# Patient Record
Sex: Female | Born: 1970 | Race: White | Hispanic: No | Marital: Single | Smoking: Never smoker
Health system: Southern US, Community
[De-identification: ages and names within clinical notes are randomized; demographics above are authoritative.]

## PROBLEM LIST (undated history)

## (undated) DIAGNOSIS — Z87898 Personal history of other specified conditions: Secondary | ICD-10-CM

## (undated) DIAGNOSIS — Z950 Presence of cardiac pacemaker: Secondary | ICD-10-CM

## (undated) DIAGNOSIS — E282 Polycystic ovarian syndrome: Secondary | ICD-10-CM

## (undated) DIAGNOSIS — R7301 Impaired fasting glucose: Secondary | ICD-10-CM

## (undated) DIAGNOSIS — R55 Syncope and collapse: Secondary | ICD-10-CM

## (undated) HISTORY — PX: TONSILLECTOMY AND ADENOIDECTOMY: SUR1326

## (undated) HISTORY — DX: Personal history of other specified conditions: Z87.898

## (undated) HISTORY — PX: LACERATION REPAIR: SHX5168

## (undated) HISTORY — PX: COLONOSCOPY: SHX174

## (undated) HISTORY — DX: Impaired fasting glucose: R73.01

## (undated) HISTORY — DX: Syncope and collapse: R55

---

## 1993-06-23 HISTORY — PX: AUGMENTATION MAMMAPLASTY: SUR837

## 1998-02-07 ENCOUNTER — Other Ambulatory Visit: Admission: RE | Admit: 1998-02-07 | Discharge: 1998-02-07 | Payer: Self-pay | Admitting: Gynecology

## 1998-07-20 ENCOUNTER — Other Ambulatory Visit: Admission: RE | Admit: 1998-07-20 | Discharge: 1998-07-20 | Payer: Self-pay | Admitting: Gynecology

## 1998-08-03 ENCOUNTER — Ambulatory Visit (HOSPITAL_COMMUNITY): Admission: RE | Admit: 1998-08-03 | Discharge: 1998-08-03 | Payer: Self-pay | Admitting: Gynecology

## 1998-10-11 ENCOUNTER — Other Ambulatory Visit: Admission: RE | Admit: 1998-10-11 | Discharge: 1998-10-11 | Payer: Self-pay | Admitting: Gynecology

## 1999-07-22 ENCOUNTER — Other Ambulatory Visit: Admission: RE | Admit: 1999-07-22 | Discharge: 1999-07-22 | Payer: Self-pay | Admitting: Gynecology

## 2000-02-11 ENCOUNTER — Other Ambulatory Visit: Admission: RE | Admit: 2000-02-11 | Discharge: 2000-02-11 | Payer: Self-pay | Admitting: Gynecology

## 2000-08-17 ENCOUNTER — Other Ambulatory Visit: Admission: RE | Admit: 2000-08-17 | Discharge: 2000-08-17 | Payer: Self-pay | Admitting: Gynecology

## 2001-10-21 ENCOUNTER — Other Ambulatory Visit: Admission: RE | Admit: 2001-10-21 | Discharge: 2001-10-21 | Payer: Self-pay | Admitting: Gynecology

## 2002-11-16 ENCOUNTER — Other Ambulatory Visit: Admission: RE | Admit: 2002-11-16 | Discharge: 2002-11-16 | Payer: Self-pay | Admitting: Gynecology

## 2004-03-14 ENCOUNTER — Other Ambulatory Visit: Admission: RE | Admit: 2004-03-14 | Discharge: 2004-03-14 | Payer: Self-pay | Admitting: Gynecology

## 2005-08-15 ENCOUNTER — Other Ambulatory Visit: Admission: RE | Admit: 2005-08-15 | Discharge: 2005-08-15 | Payer: Self-pay | Admitting: Gynecology

## 2006-11-12 ENCOUNTER — Other Ambulatory Visit: Admission: RE | Admit: 2006-11-12 | Discharge: 2006-11-12 | Payer: Self-pay | Admitting: Gynecology

## 2011-07-23 ENCOUNTER — Other Ambulatory Visit: Payer: Self-pay | Admitting: Gynecology

## 2011-07-23 DIAGNOSIS — Z1231 Encounter for screening mammogram for malignant neoplasm of breast: Secondary | ICD-10-CM

## 2011-07-31 ENCOUNTER — Ambulatory Visit
Admission: RE | Admit: 2011-07-31 | Discharge: 2011-07-31 | Disposition: A | Discharge status: Home / Self Care | Payer: BC Managed Care – PPO | Source: Ambulatory Visit | Attending: Gynecology | Admitting: Gynecology

## 2011-07-31 DIAGNOSIS — Z1231 Encounter for screening mammogram for malignant neoplasm of breast: Secondary | ICD-10-CM

## 2013-02-10 ENCOUNTER — Other Ambulatory Visit: Payer: Self-pay

## 2013-02-25 ENCOUNTER — Other Ambulatory Visit: Payer: Self-pay

## 2013-02-25 DIAGNOSIS — Z1231 Encounter for screening mammogram for malignant neoplasm of breast: Secondary | ICD-10-CM

## 2013-03-24 ENCOUNTER — Ambulatory Visit
Admission: RE | Admit: 2013-03-24 | Discharge: 2013-03-24 | Disposition: A | Discharge status: Home / Self Care | Payer: BC Managed Care – PPO | Source: Ambulatory Visit

## 2013-03-24 DIAGNOSIS — Z1231 Encounter for screening mammogram for malignant neoplasm of breast: Secondary | ICD-10-CM

## 2015-01-10 ENCOUNTER — Other Ambulatory Visit: Payer: Self-pay

## 2015-01-10 DIAGNOSIS — Z1231 Encounter for screening mammogram for malignant neoplasm of breast: Secondary | ICD-10-CM

## 2015-02-16 ENCOUNTER — Ambulatory Visit
Admission: RE | Admit: 2015-02-16 | Discharge: 2015-02-16 | Disposition: A | Discharge status: Home / Self Care | Payer: Self-pay | Source: Ambulatory Visit

## 2015-02-16 DIAGNOSIS — Z1231 Encounter for screening mammogram for malignant neoplasm of breast: Secondary | ICD-10-CM

## 2015-02-20 ENCOUNTER — Other Ambulatory Visit: Payer: Self-pay | Admitting: Internal Medicine

## 2015-02-20 DIAGNOSIS — R928 Other abnormal and inconclusive findings on diagnostic imaging of breast: Secondary | ICD-10-CM

## 2015-02-23 ENCOUNTER — Ambulatory Visit
Admission: RE | Admit: 2015-02-23 | Discharge: 2015-02-23 | Disposition: A | Discharge status: Home / Self Care | Payer: BLUE CROSS/BLUE SHIELD | Source: Ambulatory Visit | Attending: Internal Medicine | Admitting: Internal Medicine

## 2015-02-23 DIAGNOSIS — R928 Other abnormal and inconclusive findings on diagnostic imaging of breast: Secondary | ICD-10-CM

## 2015-07-17 ENCOUNTER — Other Ambulatory Visit: Payer: Self-pay | Admitting: Internal Medicine

## 2015-07-17 DIAGNOSIS — N6310 Unspecified lump in the right breast, unspecified quadrant: Secondary | ICD-10-CM

## 2015-08-23 ENCOUNTER — Ambulatory Visit
Admission: RE | Admit: 2015-08-23 | Discharge: 2015-08-23 | Disposition: A | Discharge status: Home / Self Care | Payer: BLUE CROSS/BLUE SHIELD | Source: Ambulatory Visit | Attending: Internal Medicine | Admitting: Internal Medicine

## 2015-08-23 DIAGNOSIS — N6310 Unspecified lump in the right breast, unspecified quadrant: Secondary | ICD-10-CM

## 2016-02-04 ENCOUNTER — Other Ambulatory Visit: Payer: Self-pay | Admitting: Internal Medicine

## 2016-02-04 DIAGNOSIS — Z1231 Encounter for screening mammogram for malignant neoplasm of breast: Secondary | ICD-10-CM

## 2016-02-20 ENCOUNTER — Ambulatory Visit: Payer: BLUE CROSS/BLUE SHIELD

## 2016-02-20 ENCOUNTER — Ambulatory Visit
Admission: RE | Admit: 2016-02-20 | Discharge: 2016-02-20 | Disposition: A | Discharge status: Home / Self Care | Payer: PRIVATE HEALTH INSURANCE | Source: Ambulatory Visit | Attending: Internal Medicine | Admitting: Internal Medicine

## 2016-02-20 DIAGNOSIS — Z1231 Encounter for screening mammogram for malignant neoplasm of breast: Secondary | ICD-10-CM

## 2017-02-12 ENCOUNTER — Other Ambulatory Visit: Payer: Self-pay | Admitting: Internal Medicine

## 2017-02-12 DIAGNOSIS — Z1231 Encounter for screening mammogram for malignant neoplasm of breast: Secondary | ICD-10-CM

## 2017-02-25 ENCOUNTER — Ambulatory Visit
Admission: RE | Admit: 2017-02-25 | Discharge: 2017-02-25 | Disposition: A | Discharge status: Home / Self Care | Payer: PRIVATE HEALTH INSURANCE | Source: Ambulatory Visit | Attending: Internal Medicine | Admitting: Internal Medicine

## 2017-02-25 DIAGNOSIS — Z1231 Encounter for screening mammogram for malignant neoplasm of breast: Secondary | ICD-10-CM

## 2017-06-19 DIAGNOSIS — R55 Syncope and collapse: Secondary | ICD-10-CM | POA: Insufficient documentation

## 2017-06-19 DIAGNOSIS — R7303 Prediabetes: Secondary | ICD-10-CM | POA: Insufficient documentation

## 2017-06-20 DIAGNOSIS — E162 Hypoglycemia, unspecified: Secondary | ICD-10-CM | POA: Insufficient documentation

## 2017-06-20 DIAGNOSIS — S01112A Laceration without foreign body of left eyelid and periocular area, initial encounter: Secondary | ICD-10-CM | POA: Insufficient documentation

## 2017-06-20 DIAGNOSIS — Z87898 Personal history of other specified conditions: Secondary | ICD-10-CM

## 2017-06-20 HISTORY — DX: Personal history of other specified conditions: Z87.898

## 2017-08-11 ENCOUNTER — Encounter: Payer: Self-pay | Admitting: Internal Medicine

## 2017-08-12 ENCOUNTER — Encounter: Payer: Self-pay | Admitting: *Deleted

## 2017-08-12 ENCOUNTER — Encounter: Payer: Self-pay | Admitting: Neurology

## 2017-08-12 ENCOUNTER — Ambulatory Visit (INDEPENDENT_AMBULATORY_CARE_PROVIDER_SITE_OTHER): Payer: PRIVATE HEALTH INSURANCE | Admitting: Neurology

## 2017-08-12 VITALS — BP 139/80 | HR 77 | Ht 66.0 in | Wt 158.0 lb

## 2017-08-12 DIAGNOSIS — R4182 Altered mental status, unspecified: Secondary | ICD-10-CM | POA: Diagnosis not present

## 2017-08-12 DIAGNOSIS — IMO0002 Reserved for concepts with insufficient information to code with codable children: Secondary | ICD-10-CM | POA: Insufficient documentation

## 2017-08-12 DIAGNOSIS — R4020 Unspecified coma: Secondary | ICD-10-CM | POA: Diagnosis not present

## 2017-08-12 NOTE — Progress Notes (Signed)
PATIENT: Emily Cook DOB: 1971-01-13  Chief Complaint  Patient presents with  . Syncopal Episodes    Orthostatic Vitals: Lying: 139/80, 77, Sitting: 129/79, 77, Standing: 122/79, 80, Standing x 3 minutes: 117/79, 89.  She is here with her mother, Emily Cook.  Reports three syncope episodes (Dec and Feb).  She had no warning with the episodes in December.  She was had an extensive work-up at [facility] ED that did not reveal a cause.  She felt a little hot prior to the third event on 08/11/17.  She was taken off both Metformin and Phentermine in December.  Her blood sugar has been stable off Metformin.  Marland Kitchen PCP    Martha Clan, MD     HISTORICAL  Emily Cook is a 47 year old female, seen in refer by primary care doctor Martha Clan for evaluation of syncope episode, initial evaluation was on August 12, 2017.  She is accompanied by her stepmother Emily Cook at today's clinical visit.  Reviewed and summarized the referring note, she had a history of polycystic ovarian disease, has been treated with Metformin 1500 mg daily for over a decade, also had a history of mild obesity, irregular eating patterns.  She suffered 3 passing out spells, 2 spells on June 19, 2017, he woke up in the morning, started her routine, while taking a shower, without warning signs, she passed out on the floor, with left frontal area laceration,  She was treated at local emergency room, CT head without contrast was normal  Monitoring of 2 weeks on July 21, 2017, normal sinus rhythm, occasional PVCs, no sustained arrhythmia noted, Echocardiogram from June 20, 2017, no significant abnormality. EKG December 2018, normal sinus rhythm,  Laboratory evaluations, June 20, 2017, potassium was 3.6, albumin was mildly low at 3.3, creatinine 0.58. LDL 46, triglycerides 331, CBC showed hemoglobin of 11.4,  She was discharged from emergency room 6 hours later, while walking in the parking lot the same day in the  afternoon, she has missed her breakfast first, and lunch, without warning signs, she face planted on the parking lot, passed out transiently without any warning signs,  She does report that she has occasionally heart palpitation, has cardiologist appointment pending on August 13, 2017,  Most recent passing out episode was on August 11, 2017, she woke up with her morning routine, right bikes for a few minutes, had her breakfast, morphine, water, boiled eggs, while trying to taking a shower, she noticed a rising hot sensation throughout her body, then woke up on the floor.  She previously had on healthy eating habit, including drinking diet Mountain Dew 6 cans each day, she has made major changes in her diet recently, She denies focal signs, no history of seizure  REVIEW OF SYSTEMS: Full 14 system review of systems performed and notable only for feeling hot  ALLERGIES: Not on File  HOME MEDICATIONS: Current Outpatient Medications  Medication Sig Dispense Refill  . drospirenone-ethinyl estradiol (YAZ,GIANVI,LORYNA) 3-0.02 MG tablet Take 1 tablet by mouth daily.    . metFORMIN (GLUCOPHAGE-XR) 500 MG 24 hr tablet Take 500 mg by mouth every morning.    . Multiple Vitamin (MULTIVITAMIN) tablet Take 1 tablet by mouth daily.    . vitamin B-12 (CYANOCOBALAMIN) 1000 MCG tablet Take 1,000 mcg by mouth daily.     No current facility-administered medications for this visit.     PAST MEDICAL HISTORY: Past Medical History:  Diagnosis Date  . History of prediabetes 06/20/2017  . Syncope and collapse  PAST SURGICAL HISTORY: Past Surgical History:  Procedure Laterality Date  . AUGMENTATION MAMMAPLASTY Bilateral   . lacereation of left eyebrow      FAMILY HISTORY: Family History  Problem Relation Age of Onset  . Hypertension Mother   . Colon polyps Father   . Diabetes Paternal Grandfather   . Breast cancer Neg Hx     SOCIAL HISTORY:  Social History   Socioeconomic History  .  Marital status: Single    Spouse name: Not on file  . Number of children: 0  . Years of education: 16  . Highest education level: Bachelor's degree (e.g., BA, AB, BS)  Social Needs  . Financial resource strain: Not on file  . Food insecurity - worry: Not on file  . Food insecurity - inability: Not on file  . Transportation needs - medical: Not on file  . Transportation needs - non-medical: Not on file  Occupational History  . Occupation: Airline pilots[employer]  Tobacco Use  . Smoking status: Never Smoker  . Smokeless tobacco: Never Used  Substance and Sexual Activity  . Alcohol use: No    Frequency: Never  . Drug use: No  . Sexual activity: Not on file  Other Topics Concern  . Not on file  Social History Narrative   Lives alone but has been staying with parents while having syncopal events.   Left-handed.   No caffeine use.     PHYSICAL EXAM   Vitals:   08/12/17 1129  BP: 139/80  Pulse: 77  Weight: 158 lb (71.7 kg)  Height: 5\' 6"  (1.676 m)    Not recorded      Body mass index is 25.5 kg/m.  PHYSICAL EXAMNIATION:  Gen: NAD, conversant, well nourised, obese, well groomed                     Cardiovascular: Regular rate rhythm, no peripheral edema, warm, nontender. Eyes: Conjunctivae clear without exudates or hemorrhage Neck: Supple, no carotid bruits. Pulmonary: Clear to auscultation bilaterally   NEUROLOGICAL EXAM:  MENTAL STATUS: Speech:    Speech is normal; fluent and spontaneous with normal comprehension.  Cognition:     Orientation to time, place and person     Normal recent and remote memory     Normal Attention span and concentration     Normal Language, naming, repeating,spontaneous speech     Fund of knowledge   CRANIAL NERVES: CN II: Visual fields are full to confrontation. Fundoscopic exam is normal with sharp discs and no vascular changes. Pupils are round equal and briskly reactive to light. CN III, IV, VI: extraocular movement are normal. No ptosis. CN  V: Facial sensation is intact to pinprick in all 3 divisions bilaterally. Corneal responses are intact.  CN VII: Face is symmetric with normal eye closure and smile. CN VIII: Hearing is normal to rubbing fingers CN IX, X: Palate elevates symmetrically. Phonation is normal. CN XI: Head turning and shoulder shrug are intact CN XII: Tongue is midline with normal movements and no atrophy.  MOTOR: There is no pronator drift of out-stretched arms. Muscle bulk and tone are normal. Muscle strength is normal.  REFLEXES: Reflexes are 2+ and symmetric at the biceps, triceps, knees, and ankles. Plantar responses are flexor.  SENSORY: Intact to light touch, pinprick, positional sensation and vibratory sensation are intact in fingers and toes.  COORDINATION: Rapid alternating movements and fine finger movements are intact. There is no dysmetria on finger-to-nose and heel-knee-shin.    GAIT/STANCE:  Posture is normal. Gait is steady with normal steps, base, arm swing, and turning. Heel and toe walking are normal. Tandem gait is normal.  Romberg is absent.   DIAGNOSTIC DATA (LABS, IMAGING, TESTING) - I reviewed patient records, labs, notes, testing and imaging myself where available.   ASSESSMENT AND PLAN  Kimyah Frein is a 47 y.o. female    Sudden onset episode of passing out  Differentiation diagnosis include cardiac syncope, versus seizure  Complete evaluation with MRI of the brain with and without contrast  EEG  Document of the event  According to [facility] law, patient should not drive until episode free from episodes of unexplained sudden loss of consciousness.  Levert Feinstein, M.D. Ph.D.  [facility] [address], [address] [place], [place] [address] Ph: [phone] Fax: [phone]  CC: Martha Clan, MD

## 2017-08-13 ENCOUNTER — Ambulatory Visit (INDEPENDENT_AMBULATORY_CARE_PROVIDER_SITE_OTHER): Payer: No Typology Code available for payment source | Admitting: Internal Medicine

## 2017-08-13 ENCOUNTER — Encounter: Payer: Self-pay | Admitting: Internal Medicine

## 2017-08-13 VITALS — BP 124/71 | HR 87 | Ht 66.0 in | Wt 158.0 lb

## 2017-08-13 DIAGNOSIS — R4182 Altered mental status, unspecified: Secondary | ICD-10-CM | POA: Insufficient documentation

## 2017-08-13 DIAGNOSIS — R55 Syncope and collapse: Secondary | ICD-10-CM

## 2017-08-13 NOTE — Progress Notes (Signed)
ELECTROPHYSIOLOGY CONSULT NOTE  Patient ID: Aritha Huckeba, MRN: [id number], DOB/AGE: 47-Dec-1972 47 y.o. Admit date: (Not on file) Date of Consult: 08/13/2017  Primary Physician: Martha Clan, MD Primary Cardiologist: new      [name] is a 47 y.o. female who is being seen today for the evaluation of syncope at the request of DS.    HPI [name] is a 47 y.o. female referred for syncope.  She has a history of phentermine exposure for weight loss.  She has prediabetes in the context of PCO S. 06/19/18 she was taking a shower and collapsed in the shower without warning.  Her head went through the drywall.  She recalls no prodromal symptoms and no recovery symptoms.  There is no residual orthostatic intolerance or fatigue.  She ended up going to the hospital where evaluation was negative.  She was discharged.  She went to her home and was walking out with a bag of clothes to go stay with her mom and she collapsed without warning "face planted.  She was evaluated this time with an admission.  Echocardiogram was normal; stress Myoview was normal ECG was normal.  She was given a 2-week monitor which was normal.  She then did fine until 2 days ago when she collapsed again.  She was in the bathroom about ready to get in the shower.  Had a brief prodrome of warmth.  She collapsed to the floor.  She awakened if she thinks quite momentarily later.  She was able to stand without residual orthostatic intolerance or fatigue.  She has a history of palpitations.  These are scant and infrequent.  She does not have lightheadedness with showers, orthostasis  she is fit without exercise intolerance.  She does not use alcohol or recreational drugs.  He does use a great deal of caffeine.  She had not been using caffeine, phentermine or metformin since the syncope in 12/28.  I should also note that she is lost 60 pounds Past Medical History:  Diagnosis Date  . History of prediabetes  06/20/2017  . Syncope and collapse       Surgical History:  Past Surgical History:  Procedure Laterality Date  . AUGMENTATION MAMMAPLASTY Bilateral   . lacereation of left eyebrow       Home Meds: Prior to Admission medications   Medication Sig Start Date End Date Taking? Authorizing Provider  drospirenone-ethinyl estradiol (YAZ,GIANVI,LORYNA) 3-0.02 MG tablet Take 1 tablet by mouth daily.   Yes [provider]  metFORMIN (GLUCOPHAGE-XR) 500 MG 24 hr tablet Take 500 mg by mouth every morning.   Yes [provider]  Multiple Vitamin (MULTIVITAMIN) tablet Take 1 tablet by mouth daily.   Yes [provider]  vitamin B-12 (CYANOCOBALAMIN) 1000 MCG tablet Take 1,000 mcg by mouth daily.   Yes [provider]    Allergies: Not on File  Social History   Socioeconomic History  . Marital status: Single    Spouse name: Not on file  . Number of children: 0  . Years of education: 16  . Highest education level: Bachelor's degree (e.g., BA, AB, BS)  Social Needs  . Financial resource strain: Not on file  . Food insecurity - worry: Not on file  . Food insecurity - inability: Not on file  . Transportation needs - medical: Not on file  . Transportation needs - non-medical: Not on file  Occupational History  . Occupation: Airline pilot  Tobacco Use  . Smoking status:  Never Smoker  . Smokeless tobacco: Never Used  Substance and Sexual Activity  . Alcohol use: No    Frequency: Never  . Drug use: No  . Sexual activity: Not on file  Other Topics Concern  . Not on file  Social History Narrative   Lives alone but has been staying with parents while having syncopal events.   Left-handed.   No caffeine use.     Family History  Problem Relation Age of Onset  . Hypertension Mother   . Colon polyps Father   . Diabetes Paternal Grandfather   . Breast cancer Neg Hx      ROS:  Please see the history of present illness.     All other systems reviewed and  negative.    Physical Exam: Blood pressure 124/71, pulse 87, height 5\' 6"  (1.676 m), weight 158 lb (71.7 kg), SpO2 98 %. General: Well developed, well nourished female in no acute distress. Head: Normocephalic, atraumatic, sclera non-icteric, no xanthomas, nares are without discharge. EENT: normal  Lymph Nodes:  none Neck: Negative for carotid bruits. JVD not elevated. Back:without scoliosis kyphosis Lungs: Clear bilaterally to auscultation without wheezes, rales, or rhonchi. Breathing is unlabored. Heart: RRR with S1 S2. No  murmur . No rubs, or gallops appreciated. Abdomen: Soft, non-tender, non-distended with normoactive bowel sounds. No hepatomegaly. No rebound/guarding. No obvious abdominal masses. Msk:  Strength and tone appear normal for age. Extremities: No clubbing or cyanosis. No edema.  Distal pedal pulses are 2+ and equal bilaterally. Skin: Warm and Dry Neuro: Alert and oriented X 3. CN III-XII intact Grossly normal sensory and motor function . Psych:  Responds to questions appropriately with a normal affect.      Labs: Cardiac Enzymes No results for input(s): CKTOTAL, CKMB, TROPONINI in the last 72 hours. CBC No results found for: WBC, HGB, HCT, MCV, PLT PROTIME: No results for input(s): LABPROT, INR in the last 72 hours. Chemistry No results for input(s): NA, K, CL, CO2, BUN, CREATININE, CALCIUM, PROT, BILITOT, ALKPHOS, ALT, AST, GLUCOSE in the last 168 hours.  Invalid input(s): LABALBU Lipids No results found for: CHOL, HDL, LDLCALC, TRIG BNP No results found for: PROBNP Thyroid Function Tests: No results for input(s): TSH, T4TOTAL, T3FREE, THYROIDAB in the last 72 hours.  Invalid input(s): FREET3 Miscellaneous No results found for: DDIMER  Radiology/Studies:  No results found.  EKG: Sinus at 78 Intervals 14/09/37 Axis 70 Nonspecific ST changes   Assessment and Plan:  Syncope  Phentermine exposure  PCO S    The patient has had recurrent  syncope-abrupt in onset and offset.  The only clue as to the prodrome was a bit of warmth prior to the collapse on the last event.  Her ECG is normal cardiac evaluation so far is normal.  This would make a neurally mediated reflex the most likely explanation.  The absence of a trigger is noteworthy.  There is apparently an association between phentermine exposure and vasovagal syncope that the patient has elucidated.  My research on global scholar has scant association.  We were working with the laboratory try to explore this further.  There is also a 1447-month hiatus since her last phentermine exposure to most recent episode.  I do not know how this fits into the story.  Given the fact that she has a normal heart, a bradycardia arrhythmia is unlikely not withstanding the abrupt onset and offset.  There is no hallucinations of sense to suggest temporal lobe seizures.  Her palpitations might  be a marker of a tachycardia which might be a trigger for neurally mediated reflex.  We have talked about the use of a loop recorder for the identification of arrhythmia or not in the presence of a recurrent event.  Value of tilt table testing I think in the context of her symptoms she is likely to be helpful.  I have suggested that not withstanding, empiric therapy is directed at vasomotor instability, i.e. salt water and compressive clothing are probably reasonable.  I have given her these lists.  She is advised about [place] driving restrictions and that she ought not to drive for at least 3 months.      Sherryl Manges

## 2017-08-13 NOTE — Patient Instructions (Addendum)
Medication Instructions:  Your physician recommends that you continue on your current medications as directed. Please refer to the Current Medication list given to you today.  Labwork: None ordered.  Testing/Procedures: Loop Recorder insertion  Follow-Up: Your physician recommends that you schedule a follow-up appointment in:   We will call to follow up   Any Other Special Instructions Will Be Listed Below (If Applicable).     If you need a refill on your cardiac medications before your next appointment, please call your pharmacy.

## 2017-08-13 NOTE — H&P (View-Only) (Signed)
ELECTROPHYSIOLOGY CONSULT NOTE  Patient ID: Emily Cook, MRN: [id number], DOB/AGE: 47-Dec-1972 47 y.o. Admit date: (Not on file) Date of Consult: 08/13/2017  Primary Physician: Martha Clan, MD Primary Cardiologist: new      [name] is a 47 y.o. female who is being seen today for the evaluation of syncope at the request of DS.    HPI Emily Cook is a 47 y.o. female referred for syncope.  She has a history of phentermine exposure for weight loss.  She has prediabetes in the context of PCO S. 06/19/18 she was taking a shower and collapsed in the shower without warning.  Her head went through the drywall.  She recalls no prodromal symptoms and no recovery symptoms.  There is no residual orthostatic intolerance or fatigue.  She ended up going to the hospital where evaluation was negative.  She was discharged.  She went to her home and was walking out with a bag of clothes to go stay with her mom and she collapsed without warning "face planted.  She was evaluated this time with an admission.  Echocardiogram was normal; stress Myoview was normal ECG was normal.  She was given a 2-week monitor which was normal.  She then did fine until 2 days ago when she collapsed again.  She was in the bathroom about ready to get in the shower.  Had a brief prodrome of warmth.  She collapsed to the floor.  She awakened if she thinks quite momentarily later.  She was able to stand without residual orthostatic intolerance or fatigue.  She has a history of palpitations.  These are scant and infrequent.  She does not have lightheadedness with showers, orthostasis  she is fit without exercise intolerance.  She does not use alcohol or recreational drugs.  He does use a great deal of caffeine.  She had not been using caffeine, phentermine or metformin since the syncope in 12/28.  I should also note that she is lost 60 pounds Past Medical History:  Diagnosis Date  . History of prediabetes  06/20/2017  . Syncope and collapse       Surgical History:  Past Surgical History:  Procedure Laterality Date  . AUGMENTATION MAMMAPLASTY Bilateral   . lacereation of left eyebrow       Home Meds: Prior to Admission medications   Medication Sig Start Date End Date Taking? Authorizing Provider  drospirenone-ethinyl estradiol (YAZ,GIANVI,LORYNA) 3-0.02 MG tablet Take 1 tablet by mouth daily.   Yes [provider]  metFORMIN (GLUCOPHAGE-XR) 500 MG 24 hr tablet Take 500 mg by mouth every morning.   Yes [provider]  Multiple Vitamin (MULTIVITAMIN) tablet Take 1 tablet by mouth daily.   Yes [provider]  vitamin B-12 (CYANOCOBALAMIN) 1000 MCG tablet Take 1,000 mcg by mouth daily.   Yes [provider]    Allergies: Not on File  Social History   Socioeconomic History  . Marital status: Single    Spouse name: Not on file  . Number of children: 0  . Years of education: 16  . Highest education level: Bachelor's degree (e.g., BA, AB, BS)  Social Needs  . Financial resource strain: Not on file  . Food insecurity - worry: Not on file  . Food insecurity - inability: Not on file  . Transportation needs - medical: Not on file  . Transportation needs - non-medical: Not on file  Occupational History  . Occupation: Airline pilot  Tobacco Use  . Smoking status:  Never Smoker  . Smokeless tobacco: Never Used  Substance and Sexual Activity  . Alcohol use: No    Frequency: Never  . Drug use: No  . Sexual activity: Not on file  Other Topics Concern  . Not on file  Social History Narrative   Lives alone but has been staying with parents while having syncopal events.   Left-handed.   No caffeine use.     Family History  Problem Relation Age of Onset  . Hypertension Mother   . Colon polyps Father   . Diabetes Paternal Grandfather   . Breast cancer Neg Hx      ROS:  Please see the history of present illness.     All other systems reviewed and  negative.    Physical Exam: Blood pressure 124/71, pulse 87, height 5\' 6"  (1.676 m), weight 158 lb (71.7 kg), SpO2 98 %. General: Well developed, well nourished female in no acute distress. Head: Normocephalic, atraumatic, sclera non-icteric, no xanthomas, nares are without discharge. EENT: normal  Lymph Nodes:  none Neck: Negative for carotid bruits. JVD not elevated. Back:without scoliosis kyphosis Lungs: Clear bilaterally to auscultation without wheezes, rales, or rhonchi. Breathing is unlabored. Heart: RRR with S1 S2. No  murmur . No rubs, or gallops appreciated. Abdomen: Soft, non-tender, non-distended with normoactive bowel sounds. No hepatomegaly. No rebound/guarding. No obvious abdominal masses. Msk:  Strength and tone appear normal for age. Extremities: No clubbing or cyanosis. No edema.  Distal pedal pulses are 2+ and equal bilaterally. Skin: Warm and Dry Neuro: Alert and oriented X 3. CN III-XII intact Grossly normal sensory and motor function . Psych:  Responds to questions appropriately with a normal affect.      Labs: Cardiac Enzymes No results for input(s): CKTOTAL, CKMB, TROPONINI in the last 72 hours. CBC No results found for: WBC, HGB, HCT, MCV, PLT PROTIME: No results for input(s): LABPROT, INR in the last 72 hours. Chemistry No results for input(s): NA, K, CL, CO2, BUN, CREATININE, CALCIUM, PROT, BILITOT, ALKPHOS, ALT, AST, GLUCOSE in the last 168 hours.  Invalid input(s): LABALBU Lipids No results found for: CHOL, HDL, LDLCALC, TRIG BNP No results found for: PROBNP Thyroid Function Tests: No results for input(s): TSH, T4TOTAL, T3FREE, THYROIDAB in the last 72 hours.  Invalid input(s): FREET3 Miscellaneous No results found for: DDIMER  Radiology/Studies:  No results found.  EKG: Sinus at 78 Intervals 14/09/37 Axis 70 Nonspecific ST changes   Assessment and Plan:  Syncope  Phentermine exposure  PCO S    The patient has had recurrent  syncope-abrupt in onset and offset.  The only clue as to the prodrome was a bit of warmth prior to the collapse on the last event.  Her ECG is normal cardiac evaluation so far is normal.  This would make a neurally mediated reflex the most likely explanation.  The absence of a trigger is noteworthy.  There is apparently an association between phentermine exposure and vasovagal syncope that the patient has elucidated.  My research on global scholar has scant association.  We were working with the laboratory try to explore this further.  There is also a 1447-month hiatus since her last phentermine exposure to most recent episode.  I do not know how this fits into the story.  Given the fact that she has a normal heart, a bradycardia arrhythmia is unlikely not withstanding the abrupt onset and offset.  There is no hallucinations of sense to suggest temporal lobe seizures.  Her palpitations might  be a marker of a tachycardia which might be a trigger for neurally mediated reflex.  We have talked about the use of a loop recorder for the identification of arrhythmia or not in the presence of a recurrent event.  Value of tilt table testing I think in the context of her symptoms she is likely to be helpful.  I have suggested that not withstanding, empiric therapy is directed at vasomotor instability, i.e. salt water and compressive clothing are probably reasonable.  I have given her these lists.  She is advised about [place] driving restrictions and that she ought not to drive for at least 3 months.      Sherryl Manges

## 2017-08-14 ENCOUNTER — Telehealth: Payer: Self-pay

## 2017-08-14 NOTE — Telephone Encounter (Signed)
Called patient and made her aware that she has been scheduled for her Loop Recorder Implant on 08/21/17 with Dr. Graciela HusbandsKlein at 7:00 AM. Reviewed pre-procedure instructions (see below). Made follow-up appointment with device clinic for wound check on 08/31/17 at 4:00 PM. Left surgical scrub with instruction letter up front for the patient to pick up. Patient verbalized understanding and thanked me for the call.     Pre-procedure Instructions:  Please [facility] at 6:30 AM.  You may have a light breakfast the morning of the procedure  You may take all of your medications the morning of the procedure  Wash chest & neck area with the antibacterial soap/surgical scrub the night before and the morning of the procedure  Follow-Up: Follow-up appointment in 7-10 days after implant with device clinic for wound check.- 08/31/17 at 4:00 PM

## 2017-08-17 ENCOUNTER — Telehealth: Payer: Self-pay | Admitting: Internal Medicine

## 2017-08-17 ENCOUNTER — Encounter: Payer: Self-pay | Admitting: *Deleted

## 2017-08-17 NOTE — Telephone Encounter (Signed)
New Message  Pt says that she has questions about her procedure on 08/21/2017, and wants to know if her procedure notes could be faxed because she doesn't have a ride Please call

## 2017-08-17 NOTE — Telephone Encounter (Signed)
Left message to call back  

## 2017-08-17 NOTE — Telephone Encounter (Signed)
I spoke with pt. She is unable to come to office to pick up surgical scrub and instructions.  She has purchased hibiclens from store.  She is asking for instructions to be faxed to her.  Fax is [phone].  Will fax to pt. She also wants it noted she has breast implants. Cath lab made aware.

## 2017-08-17 NOTE — Telephone Encounter (Signed)
Follow Up::   Returning call from [place] on Friday.Mi .

## 2017-08-21 ENCOUNTER — Encounter (HOSPITAL_COMMUNITY)
Admission: RE | Disposition: A | Discharge status: Home / Self Care | Payer: Self-pay | Source: Ambulatory Visit | Attending: Internal Medicine

## 2017-08-21 ENCOUNTER — Encounter (HOSPITAL_COMMUNITY): Payer: Self-pay | Admitting: Internal Medicine

## 2017-08-21 ENCOUNTER — Ambulatory Visit (HOSPITAL_COMMUNITY)
Admission: RE | Admit: 2017-08-21 | Discharge: 2017-08-21 | Disposition: A | Discharge status: Home / Self Care | Payer: No Typology Code available for payment source | Source: Ambulatory Visit | Attending: Internal Medicine | Admitting: Internal Medicine

## 2017-08-21 DIAGNOSIS — R7303 Prediabetes: Secondary | ICD-10-CM | POA: Diagnosis not present

## 2017-08-21 DIAGNOSIS — Z7984 Long term (current) use of oral hypoglycemic drugs: Secondary | ICD-10-CM | POA: Insufficient documentation

## 2017-08-21 DIAGNOSIS — R55 Syncope and collapse: Secondary | ICD-10-CM | POA: Insufficient documentation

## 2017-08-21 HISTORY — PX: LOOP RECORDER INSERTION: EP1214

## 2017-08-21 SURGERY — LOOP RECORDER INSERTION

## 2017-08-21 MED ORDER — LIDOCAINE-EPINEPHRINE 1 %-1:100000 IJ SOLN
INTRAMUSCULAR | Status: DC | PRN
  Administered 2017-08-21: 30 mL

## 2017-08-21 MED ORDER — LIDOCAINE-EPINEPHRINE 1 %-1:100000 IJ SOLN
INTRAMUSCULAR | Status: AC
  Filled 2017-08-21: qty 1

## 2017-08-21 SURGICAL SUPPLY — 2 items
LOOP REVEAL LINQSYS (Prosthesis & Implant Heart) ×3 IMPLANT
PACK LOOP INSERTION (CUSTOM PROCEDURE TRAY) ×3 IMPLANT

## 2017-08-21 NOTE — Interval H&P Note (Signed)
History and Physical Interval Note:  08/21/2017 6:52 AM  Raynelle DickMelissa Mortell  has presented today for surgery, with the diagnosis of syncope  The various methods of treatment have been discussed with the patient and family. After consideration of risks, benefits and other options for treatment, the patient has consented to  Procedure(s): LOOP RECORDER INSERTION (N/A) as a surgical intervention .  The patient's history has been reviewed, patient examined, no change in status, stable for surgery.  I have reviewed the patient's chart and labs.  Questions were answered to the patient's satisfaction.     Sherryl MangesSteven [place] Paone

## 2017-08-27 ENCOUNTER — Ambulatory Visit (INDEPENDENT_AMBULATORY_CARE_PROVIDER_SITE_OTHER): Payer: PRIVATE HEALTH INSURANCE | Admitting: Neurology

## 2017-08-27 DIAGNOSIS — R4182 Altered mental status, unspecified: Secondary | ICD-10-CM

## 2017-08-27 DIAGNOSIS — R4020 Unspecified coma: Secondary | ICD-10-CM | POA: Diagnosis not present

## 2017-08-27 DIAGNOSIS — IMO0002 Reserved for concepts with insufficient information to code with codable children: Secondary | ICD-10-CM

## 2017-08-31 ENCOUNTER — Ambulatory Visit (INDEPENDENT_AMBULATORY_CARE_PROVIDER_SITE_OTHER): Payer: Self-pay | Admitting: *Deleted

## 2017-08-31 DIAGNOSIS — R55 Syncope and collapse: Secondary | ICD-10-CM

## 2017-08-31 LAB — CUP PACEART INCLINIC DEVICE CHECK
Date Time Interrogation Session: 20190311160838
MDC IDC PG IMPLANT DT: 20190301

## 2017-08-31 NOTE — Progress Notes (Signed)
Wound Loop check in clinic. Steri-Strips removed. Incision edges approximated, no redness or edema. Battery status: Good . R-waves 0.4654mV. 0 symptom episodes, 0 tachy episodes, 0 pause episodes, 0 brady episodes. 0 AF episodes . Monthly summary reports and ROV with SK PRN. Pt educated about remote monitoring and symptom activator.

## 2017-09-03 ENCOUNTER — Telehealth: Payer: Self-pay | Admitting: Neurology

## 2017-09-03 NOTE — Telephone Encounter (Signed)
Pt called request EEG results. She has appt with Dr Clelia CroftShaw (PCP) tomorrow and would like to have those to give to him. Please call to advise

## 2017-09-04 NOTE — Procedures (Signed)
   HISTORY: 47 year old female with recurrent passing out spells  TECHNIQUE:  16 channel EEG was performed based on standard 10-16 international system. One channel was dedicated to EKG, which has demonstrates normal sinus rhythm of 96 beats per minutes.  Upon awakening, the posterior background activity was well-developed, in alpha range, reactive to eye opening and closure.  There was no evidence of epileptiform discharge.  Photic stimulation was performed, which induced a symmetric photic driving.  Hyperventilation was performed, there was no abnormality elicit.  No sleep was achieved.  CONCLUSION: This is a  normal awake EEG.  There is no electrodiagnostic evidence of epileptiform discharge.  Levert FeinsteinYijun Farhana Fellows, M.D. Ph.D.  [facility] [address] [place], [place] [address] Phone: [phone] Fax:      [phone]

## 2017-09-04 NOTE — Telephone Encounter (Signed)
Please call patient, EEG was normal, I  also fax results to her primary care.

## 2017-09-07 NOTE — Telephone Encounter (Signed)
Spoke to patient - she is aware of EEG results.  She will schedule her MRI and keep follow up.

## 2017-09-23 ENCOUNTER — Ambulatory Visit (INDEPENDENT_AMBULATORY_CARE_PROVIDER_SITE_OTHER): Payer: No Typology Code available for payment source | Admitting: *Deleted

## 2017-09-23 DIAGNOSIS — R55 Syncope and collapse: Secondary | ICD-10-CM | POA: Diagnosis not present

## 2017-09-23 NOTE — Progress Notes (Signed)
Carelink Summary Report / Loop Recorder 

## 2017-10-26 ENCOUNTER — Ambulatory Visit (INDEPENDENT_AMBULATORY_CARE_PROVIDER_SITE_OTHER): Payer: No Typology Code available for payment source | Admitting: *Deleted

## 2017-10-26 DIAGNOSIS — R55 Syncope and collapse: Secondary | ICD-10-CM

## 2017-10-26 NOTE — Progress Notes (Signed)
Carelink Summary Report / Loop Recorder 

## 2017-10-28 LAB — CUP PACEART REMOTE DEVICE CHECK
Implantable Pulse Generator Implant Date: 20190301
MDC IDC SESS DTM: 20190403120522

## 2017-11-17 LAB — CUP PACEART REMOTE DEVICE CHECK
Date Time Interrogation Session: 20190506134143
MDC IDC PG IMPLANT DT: 20190301

## 2017-11-24 ENCOUNTER — Ambulatory Visit: Payer: PRIVATE HEALTH INSURANCE | Admitting: Neurology

## 2017-11-30 ENCOUNTER — Ambulatory Visit (INDEPENDENT_AMBULATORY_CARE_PROVIDER_SITE_OTHER): Payer: No Typology Code available for payment source | Admitting: *Deleted

## 2017-11-30 DIAGNOSIS — R55 Syncope and collapse: Secondary | ICD-10-CM

## 2017-11-30 NOTE — Progress Notes (Signed)
Carelink Summary Report / Loop Recorder 

## 2017-12-29 ENCOUNTER — Encounter: Payer: Self-pay | Admitting: Internal Medicine

## 2017-12-29 ENCOUNTER — Ambulatory Visit (INDEPENDENT_AMBULATORY_CARE_PROVIDER_SITE_OTHER): Payer: No Typology Code available for payment source | Admitting: Internal Medicine

## 2017-12-29 VITALS — BP 118/71 | HR 72 | Ht 66.0 in | Wt 162.0 lb

## 2017-12-29 DIAGNOSIS — R55 Syncope and collapse: Secondary | ICD-10-CM

## 2017-12-29 LAB — CUP PACEART INCLINIC DEVICE CHECK
Implantable Pulse Generator Implant Date: 20190301
MDC IDC SESS DTM: 20190709172748

## 2017-12-29 NOTE — Progress Notes (Signed)
      Patient Care Team: Martha ClanShaw, William, MD as PCP - General (Internal Medicine)   HPI  Emily DickMelissa Cook is a 47 y.o. female Seen in follow-up for recurrent syncope.  These occurred temporally in the context of phentermine exposure.  She is stopped that subsequently and has had no recurrent episodes.  The below is copied from the initial consultationV12/28/19 she was taking a shower and collapsed in the shower without warning.  Her head went through the drywall.  She recalls no prodromal symptoms and no recovery symptoms.  There is no residual orthostatic intolerance or fatigue.  She ended up going to the hospital where evaluation was negative.  She was discharged.  She went to her home and was walking out with a bag of clothes to go stay with her mom and she collapsed without warning "face planted.  Echocardiogram was normal; stress Myoview was normal ECG was normal.  She was given a 2-week monitor which was normal.  She has done beautifully.  She is now back to working with her horses and playing tennis  Records and Results Reviewed   Past Medical History:  Diagnosis Date  . History of prediabetes 06/20/2017  . Syncope and collapse     Past Surgical History:  Procedure Laterality Date  . AUGMENTATION MAMMAPLASTY Bilateral   . lacereation of left eyebrow    . LOOP RECORDER INSERTION N/A 08/21/2017   Procedure: LOOP RECORDER INSERTION;  Surgeon: Duke SalviaKlein, Steven C, MD;  [facility];  Service: Cardiovascular;  Laterality: N/A;    Current Meds  Medication Sig  . drospirenone-ethinyl estradiol (YAZ,GIANVI,LORYNA) 3-0.02 MG tablet Take 1 tablet by mouth daily.  . Multiple Vitamin (MULTIVITAMIN) tablet Take 1 tablet by mouth daily.  . sodium chloride 1 g tablet Take 1 g by mouth daily.   . vitamin B-12 (CYANOCOBALAMIN) 1000 MCG tablet Take 1,000 mcg by mouth daily.    No Known Allergies    Review of Systems negative except from HPI and PMH  Physical Exam BP  118/71   Pulse 72   Ht 5\' 6"  (1.676 m)   Wt 162 lb (73.5 kg)   SpO2 99%   BMI 26.15 kg/m  Well developed and well nourished in no acute distress HENT normal E scleral and icterus clear Neck Supple JVP flat; carotids brisk and full Clear to ausculation Regular rate and rhythm, no murmurs gallops or rub Soft with active bowel sounds No clubbing cyanosis  Edema Alert and oriented, grossly normal motor and sensory function Skin Warm and Dry  ECG demonstrates sinus rhythm at 72 Intervals 15/09/38 Axis 64   Assessment and plan Syncope  Phentermine exposure  PCOS  Implantable loop recorder     No interval syncope.  No issues with heat intolerance or shower intolerance.  Encouraged continuing fluid and salt repletion.  We will see in 1 year     Current medicines are reviewed at length with the patient today .  The patient does not  have concerns regarding medicines.

## 2017-12-29 NOTE — Patient Instructions (Signed)

## 2018-01-01 ENCOUNTER — Ambulatory Visit (INDEPENDENT_AMBULATORY_CARE_PROVIDER_SITE_OTHER): Payer: No Typology Code available for payment source | Admitting: *Deleted

## 2018-01-01 DIAGNOSIS — R55 Syncope and collapse: Secondary | ICD-10-CM | POA: Diagnosis not present

## 2018-01-01 NOTE — Progress Notes (Signed)
Carelink Summary Report / Loop Recorder 

## 2018-01-05 LAB — CUP PACEART REMOTE DEVICE CHECK
Date Time Interrogation Session: 20190608140531
MDC IDC PG IMPLANT DT: 20190301

## 2018-02-02 ENCOUNTER — Ambulatory Visit (INDEPENDENT_AMBULATORY_CARE_PROVIDER_SITE_OTHER): Payer: No Typology Code available for payment source | Admitting: *Deleted

## 2018-02-02 DIAGNOSIS — R55 Syncope and collapse: Secondary | ICD-10-CM

## 2018-02-02 NOTE — Progress Notes (Signed)
Carelink Summary Report / Loop Recorder 

## 2018-02-11 LAB — CUP PACEART REMOTE DEVICE CHECK
Implantable Pulse Generator Implant Date: 20190301
MDC IDC SESS DTM: 20190711141011

## 2018-02-12 ENCOUNTER — Other Ambulatory Visit: Payer: Self-pay | Admitting: Internal Medicine

## 2018-02-12 DIAGNOSIS — Z1231 Encounter for screening mammogram for malignant neoplasm of breast: Secondary | ICD-10-CM

## 2018-03-08 ENCOUNTER — Ambulatory Visit (INDEPENDENT_AMBULATORY_CARE_PROVIDER_SITE_OTHER): Payer: No Typology Code available for payment source | Admitting: *Deleted

## 2018-03-08 DIAGNOSIS — R55 Syncope and collapse: Secondary | ICD-10-CM

## 2018-03-08 NOTE — Progress Notes (Signed)
Carelink Summary Report / Loop Recorder 

## 2018-03-11 ENCOUNTER — Other Ambulatory Visit: Payer: Self-pay | Admitting: Internal Medicine

## 2018-03-11 ENCOUNTER — Other Ambulatory Visit: Payer: Self-pay | Admitting: Family Medicine

## 2018-03-11 ENCOUNTER — Ambulatory Visit
Admission: RE | Admit: 2018-03-11 | Discharge: 2018-03-11 | Disposition: A | Discharge status: Home / Self Care | Payer: No Typology Code available for payment source | Source: Ambulatory Visit | Attending: Internal Medicine | Admitting: Internal Medicine

## 2018-03-11 DIAGNOSIS — Z1231 Encounter for screening mammogram for malignant neoplasm of breast: Secondary | ICD-10-CM

## 2018-03-11 LAB — CUP PACEART REMOTE DEVICE CHECK
Implantable Pulse Generator Implant Date: 20190301
MDC IDC SESS DTM: 20190813164023

## 2018-03-21 LAB — CUP PACEART REMOTE DEVICE CHECK
Date Time Interrogation Session: 20190915174157
Implantable Pulse Generator Implant Date: 20190301

## 2018-04-09 ENCOUNTER — Ambulatory Visit (INDEPENDENT_AMBULATORY_CARE_PROVIDER_SITE_OTHER): Payer: No Typology Code available for payment source | Admitting: *Deleted

## 2018-04-09 DIAGNOSIS — R55 Syncope and collapse: Secondary | ICD-10-CM

## 2018-04-12 NOTE — Progress Notes (Signed)
Carelink Summary Report / Loop Recorder 

## 2018-04-27 LAB — CUP PACEART REMOTE DEVICE CHECK
Date Time Interrogation Session: 20191018173910
MDC IDC PG IMPLANT DT: 20190301

## 2018-05-12 ENCOUNTER — Ambulatory Visit (INDEPENDENT_AMBULATORY_CARE_PROVIDER_SITE_OTHER): Payer: No Typology Code available for payment source

## 2018-05-12 ENCOUNTER — Telehealth: Payer: Self-pay | Admitting: Cardiology

## 2018-05-12 DIAGNOSIS — R55 Syncope and collapse: Secondary | ICD-10-CM | POA: Diagnosis not present

## 2018-05-12 NOTE — Telephone Encounter (Signed)
Patient called and stated that she had 3 episodes yesterday (Tuesday 05-11-2018). When she got home yesterday evening around 6-7 PM she became dizzy again and passed out this time. Patient was not near her home monitor and she did use her symptom activator for the 6 -7 PM episode but not the ones earlier in the day. Call routed to [employer] RN.

## 2018-05-12 NOTE — Telephone Encounter (Signed)
Apt scheduled to see Dr. Graciela HusbandsKlein at 2:45 pt instructed not to drive and if she passes out before apt tomorrow to call 911 and go to the ED and do not drive herself. Pt voiced understanding

## 2018-05-12 NOTE — Telephone Encounter (Signed)
Spoke with pt informed her that she would need to go home and send a transmission. Advised pt not to drive herself since she has had a syncopal episode.

## 2018-05-12 NOTE — Telephone Encounter (Signed)
Manual transmission received. ECGs from 05/11/18 show a pause episode at 1910 (device time) correlating with symptom episode. Huston FoleyBrady episode also noted from 1114, duration 14sec. Reviewed episodes with Dr. Rubin PayorAllred--plan for urgent f/u with Dr. Graciela HusbandsKlein tomorrow, 05/13/18. No driving.  Attempted to reach patient at both numbers listed. LM and gave direct DC number for return call.

## 2018-05-13 ENCOUNTER — Encounter: Payer: Self-pay | Admitting: Internal Medicine

## 2018-05-13 ENCOUNTER — Ambulatory Visit (INDEPENDENT_AMBULATORY_CARE_PROVIDER_SITE_OTHER): Payer: No Typology Code available for payment source | Admitting: Internal Medicine

## 2018-05-13 VITALS — BP 106/82 | HR 75 | Ht 66.0 in | Wt 172.8 lb

## 2018-05-13 DIAGNOSIS — I455 Other specified heart block: Secondary | ICD-10-CM

## 2018-05-13 DIAGNOSIS — R55 Syncope and collapse: Secondary | ICD-10-CM

## 2018-05-13 NOTE — Progress Notes (Signed)
Carelink Summary Report / Loop Recorder 

## 2018-05-13 NOTE — Progress Notes (Signed)
    Patient Care Team: Shaw, William, MD as PCP - General (Internal Medicine)   HPI  Emily Cook is a 47 y.o. female Seen as an add on today because of syncope with document asystole >10sec   These originally occurred temporally in the context of phentermine exposure.  Had no recurrent episodes.  She had three spells on Tues--the first were presyncopal and assoc with nausea.  The last episode occurred while sitting on the floor-- she got up aware that she was presyncopal, sat in a chair and LOC  LINQ>> sinus slowing with prolonged asystole >12 sec  No obvious triggers; no antecedent illness GI or GU symptoms   The below is copied from the initial consultationV12/28/19 she was taking a shower and collapsed in the shower without warning.  Her head went through the drywall.  She recalls no prodromal symptoms and no recovery symptoms.  There is no residual orthostatic intolerance or fatigue.  She ended up going to the hospital where evaluation was negative.  She was discharged.  She went to her home and was walking out with a bag of clothes to go stay with her mom and she collapsed without warning "face planted.  Echocardiogram was normal; stress Myoview was normal ECG was normal.  She was given a 2-week monitor which was normal.   Records and Results Reviewed   Past Medical History:  Diagnosis Date  . History of prediabetes 06/20/2017  . Syncope and collapse     Past Surgical History:  Procedure Laterality Date  . AUGMENTATION MAMMAPLASTY Bilateral   . lacereation of left eyebrow    . LOOP RECORDER INSERTION N/A 08/21/2017   Procedure: LOOP RECORDER INSERTION;  Surgeon: ,  C, MD;  Location: [facility] INVASIVE CV LAB;  Service: Cardiovascular;  Laterality: N/A;    Current Meds  Medication Sig  . drospirenone-ethinyl estradiol (YAZ,GIANVI,LORYNA) 3-0.02 MG tablet Take 1 tablet by mouth daily.  . Multiple Vitamin (MULTIVITAMIN) tablet Take 1 tablet by mouth daily.  .  sodium chloride 1 g tablet Take 1 g by mouth daily.   . vitamin B-12 (CYANOCOBALAMIN) 1000 MCG tablet Take 1,000 mcg by mouth daily.    No Known Allergies    Review of Systems negative except from HPI and PMH  Physical Exam BP 106/82   Pulse 75   Ht 5' 6" (1.676 m)   Wt 172 lb 12.8 oz (78.4 kg)   SpO2 99%   BMI 27.89 kg/m  Well developed and nourished in no acute distress HENT normal Neck supple with JVP-flat Clear Regular rate and rhythm, no murmurs or gallops Abd-soft with active BS No Clubbing cyanosis edema Skin-warm and dry A & Oriented  Grossly normal sensory and motor function  ECG sinus @ 75 14/08/38  Assessment and plan Syncope with prolonged asystole  Phentermine exposure  PCOS  Implantable loop recorder     The patient had recurrent syncope and presyncope.  Events recorded both were associated with sinus slowing and then bradycardia on the first occasion and asystole on the second.  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  Unfortunately, there is not a clear trigger and so we had a lengthy discussion regarding trying to address the physiology on the effector limb.  She is using her compressive wear again.  Potentially,  there is a role for CLS pacing given the documented prolonged pause.  I reviewed that it would not address the vaso-depressor effects; however, it might sufficiently prolong her prodrome   of which she is aware to avoid syncope.  The benefits and risks were reviewed including but not limited to death,  perforation, infection, lead dislodgement and device malfunction.  The patient understands agrees and is willing to proceed.  She would like to discuss with her father and will let us know  More than 50% of 40 min was spent in counseling related to the above   Current medicines are reviewed at length with the patient today .  The patient does not  have concerns regarding medicines.  

## 2018-05-13 NOTE — H&P (View-Only) (Signed)
Patient Care Team: Martha Clan, MD as PCP - General (Internal Medicine)   HPI  Emily Cook is a 47 y.o. female Seen as an add on today because of syncope with document asystole >10sec   These originally occurred temporally in the context of phentermine exposure.  Had no recurrent episodes.  She had three spells on Tues--the first were presyncopal and assoc with nausea.  The last episode occurred while sitting on the floor-- she got up aware that she was presyncopal, sat in a chair and LOC  LINQ>> sinus slowing with prolonged asystole >12 sec  No obvious triggers; no antecedent illness GI or GU symptoms   The below is copied from the initial consultationV12/28/19 she was taking a shower and collapsed in the shower without warning.  Her head went through the drywall.  She recalls no prodromal symptoms and no recovery symptoms.  There is no residual orthostatic intolerance or fatigue.  She ended up going to the hospital where evaluation was negative.  She was discharged.  She went to her home and was walking out with a bag of clothes to go stay with her mom and she collapsed without warning "face planted.  Echocardiogram was normal; stress Myoview was normal ECG was normal.  She was given a 2-week monitor which was normal.   Records and Results Reviewed   Past Medical History:  Diagnosis Date  . History of prediabetes 06/20/2017  . Syncope and collapse     Past Surgical History:  Procedure Laterality Date  . AUGMENTATION MAMMAPLASTY Bilateral   . lacereation of left eyebrow    . LOOP RECORDER INSERTION N/A 08/21/2017   Procedure: LOOP RECORDER INSERTION;  Surgeon: Duke Salvia, MD;  Location: [facility] INVASIVE CV LAB;  Service: Cardiovascular;  Laterality: N/A;    Current Meds  Medication Sig  . drospirenone-ethinyl estradiol (YAZ,GIANVI,LORYNA) 3-0.02 MG tablet Take 1 tablet by mouth daily.  . Multiple Vitamin (MULTIVITAMIN) tablet Take 1 tablet by mouth daily.  .  sodium chloride 1 g tablet Take 1 g by mouth daily.   . vitamin B-12 (CYANOCOBALAMIN) 1000 MCG tablet Take 1,000 mcg by mouth daily.    No Known Allergies    Review of Systems negative except from HPI and PMH  Physical Exam BP 106/82   Pulse 75   Ht 5\' 6"  (1.676 m)   Wt 172 lb 12.8 oz (78.4 kg)   SpO2 99%   BMI 27.89 kg/m  Well developed and nourished in no acute distress HENT normal Neck supple with JVP-flat Clear Regular rate and rhythm, no murmurs or gallops Abd-soft with active BS No Clubbing cyanosis edema Skin-warm and dry A & Oriented  Grossly normal sensory and motor function  ECG sinus @ 75 14/08/38  Assessment and plan Syncope with prolonged asystole  Phentermine exposure  PCOS  Implantable loop recorder     The patient had recurrent syncope and presyncope.  Events recorded both were associated with sinus slowing and then bradycardia on the first occasion and asystole on the second.  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  Unfortunately, there is not a clear trigger and so we had a lengthy discussion regarding trying to address the physiology on the effector limb.  She is using her compressive wear again.  Potentially,  there is a role for CLS pacing given the documented prolonged pause.  I reviewed that it would not address the vaso-depressor effects; however, it might sufficiently prolong her prodrome  of which she is aware to avoid syncope.  The benefits and risks were reviewed including but not limited to death,  perforation, infection, lead dislodgement and device malfunction.  The patient understands agrees and is willing to proceed.  She would like to discuss with her father and will let us know  More than 50% of 40 min was spent in counseling related to the above   Current medicines are reviewed at length with the patient today .  The patient does not  have concerns regarding medicines.

## 2018-05-13 NOTE — Patient Instructions (Signed)
Medication Instructions:  Your physician recommends that you continue on your current medications as directed. Please refer to the Current Medication list given to you today.  Labwork: None ordered.  Testing/Procedures:  Your physician has recommended that you have a pacemaker inserted. A pacemaker is a small device that is placed under the skin of your chest or abdomen to help control abnormal heart rhythms. This device uses electrical pulses to prompt the heart to beat at a normal rate. Pacemakers are used to treat heart rhythms that are too slow. Wire (leads) are attached to the pacemaker that goes into the chambers of you heart. This is done in the hospital and usually requires and overnight stay. Please see the instruction sheet given to you today for more information.   Follow-Up:   Please call us when you are ready to set up your Pacemaker procedure.  November 22 November 29 December 4 December 9 December 20 December 23 December 27  Any Other Special Instructions Will Be Listed Below (If Applicable).   Pacemaker Implantation A pacemaker implantation is a procedure to place (implant) a pacemaker into both of the lower chambers (ventricles) of the heart. A pacemaker is a small, battery-powered device that helps control the heartbeat. If the heart beats irregularly or too slowly (bradycardia), the pacemaker will pace the heart so that it beats at a normal rate or a programmed rate. The parts of a biventricular pacemaker include:  The pulse generator. The pulse generator contains a small computer and a memory system that is programmed to keep the heart beating at a certain rate. The pulse generator also produces the electrical signal that triggers the heart to beat. This is implanted under the skin of the upper chest, near the collarbone.  Wires (leads). The leads are placed in the left and right ventricles of the heart. The leads are connected to the pulse generator. They transmit  electrical pulses from the pulse generator to the heart.  This procedure may be done to treat:  Bradycardia.  Symptoms of severe heart failure, such as shortness of breath (dyspnea).  Loss of consciousness that happens repeatedly (syncope) because of an irregular heart rate.  Tell a health care provider about:  Any allergies you have.  All medicines you are taking, including vitamins, herbs, eye drops, creams, and over-the-counter medicines.  Any problems you or family members have had with anesthetic medicines.  Any blood disorders you have.  Any surgeries you have had.  Any medical conditions you have.  Whether you are pregnant or may be pregnant. What are the risks? Generally, this is a safe procedure. However, problems may occur, including:  Infection.  Bleeding.  Allergic reactions to medicines or dyes.  Damage to other structures or organs, such as your blood vessels, lungs, or heart.  Failure of the pacemaker to improve your condition.  What happens before the procedure?  Ask your health care provider about: ? Changing or stopping your regular medicines. This is especially important if you are taking diabetes medicines or blood thinners. ? Taking medicines such as aspirin and ibuprofen. These medicines can thin your blood. Do not take these medicines before your procedure if your health care provider instructs you not to.  Follow instructions from your health care provider about eating or drinking restrictions.  Do not use any tobacco products for at least 24 hours before your procedure. This includes cigarettes, chewing tobacco, or e-cigarettes.  Ask your health care provider how your surgical site will be marked  or identified.  You may be given antibiotic medicine to help prevent infection.  You may have tests, including: ? Blood tests. ? Chest X-rays.  Plan to have someone take you home after the procedure.  If you go home right after the procedure,  plan to have someone with you for 24 hours. What happens during the procedure?  To reduce your risk of infection: ? Your health care team will wash or sanitize their hands. ? Your skin will be washed with soap. ? Hair may be removed from your surgical area.  An IV tube will be inserted into one of your veins.  You will be given one or more of the following: ? A medicine to help you relax (sedative). ? A medicine to make you fall asleep (general anesthetic). ? A medicine that is injected into your spine to numb the area below and slightly above the injection site (spinal anesthetic). ? A medicine that is injected into an area of your body to numb everything below the injection site (regional anesthetic).  An incision will be made in your upper chest, near your heart.  The leads will be guided into your incision, through your blood vessels, and into your ventricles. Your surgeon will use an X-ray machine (fluoroscope) to guide the leads into your heart.  The leads will be attached to your heart muscles and to the pulse generator.  The leads will be tested to make sure that they work correctly.  The pulse generator will be implanted under your skin, near your incision.  Your incision will be closed with stitches (sutures), skin glue, or adhesive tape.  A bandage (dressing) will be placed over your incision. The procedure may vary among health care providers and hospitals. What happens after the procedure?  Your blood pressure, heart rate, breathing rate, and blood oxygen level will be monitored often until the medicines you were given have worn off.  You may continue to receive fluids and medicines through an IV tube.  You will have some pain. Pain medicines will be available to help you.  You will have a chest X-ray done. This is to make sure that your pacemaker is in the right place.  You may have to wear compression stockings. These stockings help to prevent blood clots and  reduce swelling in your legs.  You will be given a pacemaker identification card. This card lists the implant date, device model, and manufacturer of your pacemaker.  Do not drive for 24 hours if you received a sedative. This information is not intended to replace advice given to you by your health care provider. Make sure you discuss any questions you have with your health care provider. Document Released: 03/03/2012 Document Revised: 11/15/2015 Document Reviewed: 03/04/2015 Elsevier Interactive Patient Education  [employer]2018 [employer]    If you need a refill on your cardiac medications before your next appointment, please call your pharmacy.

## 2018-05-18 ENCOUNTER — Other Ambulatory Visit: Payer: Self-pay | Admitting: Internal Medicine

## 2018-05-18 ENCOUNTER — Telehealth: Payer: Self-pay

## 2018-05-18 DIAGNOSIS — R55 Syncope and collapse: Secondary | ICD-10-CM

## 2018-05-18 NOTE — Telephone Encounter (Signed)
Spoke with pt regarding pre procedural instructions. Pt is aware of instructions sent through mychart and will review as well. Pt has verbalized understanding of all instructions and post procedural limitations. She had no additional questions.

## 2018-05-21 ENCOUNTER — Other Ambulatory Visit: Payer: Self-pay

## 2018-05-21 ENCOUNTER — Ambulatory Visit (HOSPITAL_COMMUNITY)
Admission: RE | Admit: 2018-05-21 | Discharge: 2018-05-22 | Disposition: A | Discharge status: Home / Self Care | Payer: No Typology Code available for payment source | Source: Ambulatory Visit | Attending: Internal Medicine | Admitting: Internal Medicine

## 2018-05-21 ENCOUNTER — Encounter (HOSPITAL_COMMUNITY)
Admission: RE | Disposition: A | Discharge status: Home / Self Care | Payer: Self-pay | Source: Ambulatory Visit | Attending: Internal Medicine

## 2018-05-21 ENCOUNTER — Encounter (HOSPITAL_COMMUNITY): Payer: Self-pay | Admitting: General Practice

## 2018-05-21 DIAGNOSIS — R7303 Prediabetes: Secondary | ICD-10-CM | POA: Diagnosis not present

## 2018-05-21 DIAGNOSIS — E282 Polycystic ovarian syndrome: Secondary | ICD-10-CM | POA: Diagnosis not present

## 2018-05-21 DIAGNOSIS — Z9889 Other specified postprocedural states: Secondary | ICD-10-CM | POA: Insufficient documentation

## 2018-05-21 DIAGNOSIS — I469 Cardiac arrest, cause unspecified: Secondary | ICD-10-CM | POA: Diagnosis not present

## 2018-05-21 DIAGNOSIS — R55 Syncope and collapse: Secondary | ICD-10-CM | POA: Diagnosis not present

## 2018-05-21 DIAGNOSIS — I495 Sick sinus syndrome: Secondary | ICD-10-CM

## 2018-05-21 DIAGNOSIS — Z950 Presence of cardiac pacemaker: Secondary | ICD-10-CM

## 2018-05-21 DIAGNOSIS — Z959 Presence of cardiac and vascular implant and graft, unspecified: Secondary | ICD-10-CM

## 2018-05-21 HISTORY — DX: Polycystic ovarian syndrome: E28.2

## 2018-05-21 HISTORY — DX: Presence of cardiac pacemaker: Z95.0

## 2018-05-21 HISTORY — PX: LOOP RECORDER REMOVAL: EP1215

## 2018-05-21 HISTORY — PX: PACEMAKER IMPLANT: EP1218

## 2018-05-21 HISTORY — PX: INSERT / REPLACE / REMOVE PACEMAKER: SUR710

## 2018-05-21 LAB — BASIC METABOLIC PANEL
Anion gap: 8 (ref 5–15)
BUN: 12 mg/dL (ref 6–20)
CHLORIDE: 106 mmol/L (ref 98–111)
CO2: 25 mmol/L (ref 22–32)
CREATININE: 0.73 mg/dL (ref 0.44–1.00)
Calcium: 9.5 mg/dL (ref 8.9–10.3)
GFR calc Af Amer: >60 mL/min (ref >60)
GFR calc non Af Amer: >60 mL/min (ref >60)
Glucose, Bld: 88 mg/dL (ref 70–99)
POTASSIUM: 4.1 mmol/L (ref 3.5–5.1)
SODIUM: 139 mmol/L (ref 135–145)

## 2018-05-21 LAB — CBC
HEMATOCRIT: 45.6 % (ref 36.0–46.0)
HEMOGLOBIN: 14.7 g/dL (ref 12.0–15.0)
MCH: 27.8 pg (ref 26.0–34.0)
MCHC: 32.2 g/dL (ref 30.0–36.0)
MCV: 86.4 fL (ref 80.0–100.0)
NRBC: 0 % (ref 0.0–0.2)
Platelets: 255 10*3/uL (ref 150–400)
RBC: 5.28 MIL/uL — AB (ref 3.87–5.11)
RDW: 11.9 % (ref 11.5–15.5)
WBC: 8.8 10*3/uL (ref 4.0–10.5)

## 2018-05-21 LAB — SURGICAL PCR SCREEN
MRSA, PCR: NEGATIVE
Staphylococcus aureus: NEGATIVE

## 2018-05-21 LAB — PREGNANCY, URINE: Preg Test, Ur: NEGATIVE

## 2018-05-21 SURGERY — PACEMAKER IMPLANT

## 2018-05-21 MED ORDER — CEFAZOLIN SODIUM-DEXTROSE 1-4 GM/50ML-% IV SOLN
1.0000 g | Freq: Four times a day (QID) | INTRAVENOUS | Status: AC
  Administered 2018-05-21 – 2018-05-22 (×3): 1 g via INTRAVENOUS
  Filled 2018-05-21 (×3): qty 50

## 2018-05-21 MED ORDER — ADULT MULTIVITAMIN W/MINERALS CH
1.0000 | ORAL_TABLET | Freq: Every day | ORAL | Status: DC
  Filled 2018-05-21: qty 1

## 2018-05-21 MED ORDER — SODIUM CHLORIDE 0.9 % IV SOLN
INTRAVENOUS | Status: AC
  Administered 2018-05-21: 17:00:00 via INTRAVENOUS

## 2018-05-21 MED ORDER — YOU HAVE A PACEMAKER BOOK
Freq: Once | Status: AC
  Administered 2018-05-21: 21:00:00
  Filled 2018-05-21: qty 1

## 2018-05-21 MED ORDER — MIDAZOLAM HCL 5 MG/5ML IJ SOLN
INTRAMUSCULAR | Status: AC
  Filled 2018-05-21: qty 5

## 2018-05-21 MED ORDER — MUPIROCIN 2 % EX OINT
TOPICAL_OINTMENT | CUTANEOUS | Status: AC
  Administered 2018-05-21: 1 via TOPICAL
  Filled 2018-05-21: qty 22

## 2018-05-21 MED ORDER — CEFAZOLIN SODIUM-DEXTROSE 2-4 GM/100ML-% IV SOLN
INTRAVENOUS | Status: AC
  Filled 2018-05-21: qty 100

## 2018-05-21 MED ORDER — SODIUM CHLORIDE 0.9 % IV SOLN
INTRAVENOUS | Status: DC
  Administered 2018-05-21: 14:00:00 via INTRAVENOUS

## 2018-05-21 MED ORDER — FENTANYL CITRATE (PF) 100 MCG/2ML IJ SOLN
INTRAMUSCULAR | Status: DC | PRN
  Administered 2018-05-21: 25 ug via INTRAVENOUS
  Administered 2018-05-21: 50 ug via INTRAVENOUS
  Administered 2018-05-21 (×3): 25 ug via INTRAVENOUS

## 2018-05-21 MED ORDER — CHLORHEXIDINE GLUCONATE 4 % EX LIQD: 60.0000 mL | Freq: Once | CUTANEOUS | Status: DC

## 2018-05-21 MED ORDER — LIDOCAINE HCL (PF) 1 % IJ SOLN
INTRAMUSCULAR | Status: AC
  Filled 2018-05-21: qty 60

## 2018-05-21 MED ORDER — ONDANSETRON HCL 4 MG/2ML IJ SOLN: 4.0000 mg | Freq: Four times a day (QID) | INTRAMUSCULAR | Status: DC | PRN

## 2018-05-21 MED ORDER — DROSPIRENONE-ETHINYL ESTRADIOL 3-0.02 MG PO TABS: 1.0000 | ORAL_TABLET | Freq: Every day | ORAL | Status: DC

## 2018-05-21 MED ORDER — FENTANYL CITRATE (PF) 100 MCG/2ML IJ SOLN
INTRAMUSCULAR | Status: AC
  Filled 2018-05-21: qty 2

## 2018-05-21 MED ORDER — CEFAZOLIN SODIUM-DEXTROSE 2-4 GM/100ML-% IV SOLN
2.0000 g | INTRAVENOUS | Status: AC
  Administered 2018-05-21: 2 g via INTRAVENOUS

## 2018-05-21 MED ORDER — HEPARIN (PORCINE) IN NACL 2-0.9 UNITS/ML
INTRAMUSCULAR | Status: AC | PRN
  Administered 2018-05-21: 500 mL

## 2018-05-21 MED ORDER — MUPIROCIN 2 % EX OINT
1.0000 "application " | TOPICAL_OINTMENT | Freq: Once | CUTANEOUS | Status: AC
  Administered 2018-05-21: 1 via TOPICAL

## 2018-05-21 MED ORDER — MIDAZOLAM HCL 5 MG/5ML IJ SOLN
INTRAMUSCULAR | Status: DC | PRN
  Administered 2018-05-21 (×2): 2 mg via INTRAVENOUS
  Administered 2018-05-21 (×3): 1 mg via INTRAVENOUS

## 2018-05-21 MED ORDER — VITAMIN B-12 1000 MCG PO TABS: 1000.0000 ug | ORAL_TABLET | Freq: Every day | ORAL | Status: DC

## 2018-05-21 MED ORDER — SODIUM CHLORIDE 0.9 % IV SOLN
80.0000 mg | INTRAVENOUS | Status: AC
  Administered 2018-05-21: 80 mg

## 2018-05-21 MED ORDER — HEPARIN (PORCINE) IN NACL 1000-0.9 UT/500ML-% IV SOLN
INTRAVENOUS | Status: AC
  Filled 2018-05-21: qty 500

## 2018-05-21 MED ORDER — ACETAMINOPHEN 325 MG PO TABS
325.0000 mg | ORAL_TABLET | ORAL | Status: DC | PRN
  Administered 2018-05-21: 325 mg via ORAL
  Administered 2018-05-22 (×2): 650 mg via ORAL
  Filled 2018-05-21 (×3): qty 2

## 2018-05-21 MED ORDER — LIDOCAINE HCL (PF) 1 % IJ SOLN
INTRAMUSCULAR | Status: DC | PRN
  Administered 2018-05-21: 60 mL

## 2018-05-21 MED ORDER — SODIUM CHLORIDE 0.9 % IV SOLN
INTRAVENOUS | Status: AC
  Filled 2018-05-21: qty 2

## 2018-05-21 SURGICAL SUPPLY — 8 items
CABLE SURGICAL S-101-97-12 (CABLE) ×3 IMPLANT
HEMOSTAT SURGICEL 2X4 FIBR (HEMOSTASIS) ×3 IMPLANT
LEAD CAPSURE NOVUS 5076-52CM (Lead) ×3 IMPLANT
LEAD CAPSURE NOVUS 5076-58CM (Lead) ×3 IMPLANT
PACEMAKER EDORA 8DR-T MRI (Pacemaker) ×3 IMPLANT
PAD PRO RADIOLUCENT 2001M-C (PAD) ×3 IMPLANT
SHEATH CLASSIC 7F (SHEATH) ×6 IMPLANT
TRAY PACEMAKER INSERTION (PACKS) ×3 IMPLANT

## 2018-05-21 NOTE — Interval H&P Note (Signed)
History and Physical Interval Note:  05/21/2018 3:03 PM  Emily KaufmannMelissa Humphrey RollsMarlowe  has presented today for surgery, with the diagnosis of sinus pause  The various methods of treatment have been discussed with the patient and family. After consideration of risks, benefits and other options for treatment, the patient has consented to  Procedure(s): PACEMAKER IMPLANT - Dual Chamber (N/A) as a surgical intervention .  The patient's history has been reviewed, patient examined, no change in status, stable for surgery.  I have reviewed the patient's chart and labs.  Questions were answered to the patient's satisfaction.     Sherryl MangesSteven   No interval syncope Questions answered

## 2018-05-21 NOTE — Discharge Instructions (Signed)
° ° °  Supplemental Discharge Instructions for  Pacemaker/Defibrillator Patients  Activity No heavy lifting or vigorous activity with your left/right arm for 6 to 8 weeks.  Do not raise your left/right arm above your head for one week.  Gradually raise your affected arm as drawn below.             05/25/18                     05/26/18                    05/27/18                  05/28/18 __  NO DRIVING for  1 week   ; you may begin driving on  16/06/910/6/19  .  WOUND CARE - Keep the wound area clean and dry.  Do not get this area wet for one week. No showers for one week; you may shower on  05/28/18  . - The tape/steri-strips on your wound will fall off; do not pull them off.  No bandage is needed on the site.  DO  NOT apply any creams, oils, or ointments to the wound area. - If you notice any drainage or discharge from the wound, any swelling or bruising at the site, or you develop a fever > 101? F after you are discharged home, call the office at once.  Special Instructions - You are still able to use cellular telephones; use the ear opposite the side where you have your pacemaker/defibrillator.  Avoid carrying your cellular phone near your device. - When traveling through airports, show security personnel your identification card to avoid being screened in the metal detectors.  Ask the security personnel to use the hand wand. - Avoid arc welding equipment, MRI testing (magnetic resonance imaging), TENS units (transcutaneous nerve stimulators).  Call the office for questions about other devices. - Avoid electrical appliances that are in poor condition or are not properly grounded. - Microwave ovens are safe to be near or to operate.

## 2018-05-22 ENCOUNTER — Ambulatory Visit (HOSPITAL_COMMUNITY): Payer: No Typology Code available for payment source

## 2018-05-22 DIAGNOSIS — I469 Cardiac arrest, cause unspecified: Secondary | ICD-10-CM | POA: Diagnosis not present

## 2018-05-22 DIAGNOSIS — E282 Polycystic ovarian syndrome: Secondary | ICD-10-CM | POA: Diagnosis not present

## 2018-05-22 DIAGNOSIS — I495 Sick sinus syndrome: Secondary | ICD-10-CM | POA: Diagnosis not present

## 2018-05-22 DIAGNOSIS — R55 Syncope and collapse: Secondary | ICD-10-CM | POA: Diagnosis not present

## 2018-05-22 MED ORDER — SODIUM CHLORIDE 0.9 % IV SOLN
INTRAVENOUS | Status: DC | PRN
  Administered 2018-05-22: 1000 mL via INTRAVENOUS

## 2018-05-22 NOTE — Discharge Summary (Addendum)
Discharge Summary    Patient ID: Emily Cook MRN: [id number]; DOB: [birth year]  Admit date: 05/21/2018 Discharge date: 05/22/2018  Primary Care Provider: Martha Clan, MD  Primary Cardiologist: Sherryl Manges, MD  Primary Electrophysiologist:  None   Discharge Diagnoses    Principal Problem:   Sinus node dysfunction [facility]) Active Problems:   Prediabetes   Allergies No Known Allergies  Diagnostic Studies/Procedures    Dual Chamber PPM 05/21/2018  LOOP RECORDER REMOVAL  PACEMAKER IMPLANT - Dual Chamber   Medtronic MRI compatible 5076 ventricular lead serial [id number]  Medtronic MRI compatible 5076 atrial lead serial number [id number] BIOTRONIK pulse generator serial number [id number].  _____________   History of Present Illness     Emily Cook is a 47 y.o. female Seen as an add on today because of syncope with document asystole >10sec   These originally occurred temporally in the context of phentermine exposure.  Had no recurrent episodes.  She had three spells on Tues--the first were presyncopal and assoc with nausea.  The last episode occurred while sitting on the floor-- she got up aware that she was presyncopal, sat in a chair and LOC  LINQ>> sinus slowing with prolonged asystole >12 sec  No obvious triggers; no antecedent illness GI or GU symptoms   The below is copied from the initial consultationV12/28/19 she was taking a shower and collapsed in the shower without warning. Her head went through the drywall. She recalls no prodromal symptoms and no recovery symptoms. There is no residual orthostatic intolerance or fatigue. She ended up going to the hospital where evaluation was negative.  She was discharged. She went to her home and was walking out with a bag of clothes to go stay with her mom and she collapsed without warning "face planted.  Echocardiogram was normal; stress Myoview was normal ECG was normal. She was given a 2-week  monitor which was normal.  Hospital Course     Consultants: N/A  Due to the sinus asystole seen on recent loop recorder, patient presented on 05/21/2018 for pacemaker implantation.  She had a Biotronik dual-chamber pacemaker placed without significant complication.  Postprocedure, patient was seen in the morning of 05/22/2018, at which time she was doing well without significant pain.  Chest x-ray and device interrogation in the morning of 11/30 was normal.  Patient was seen by Dr. Elberta Fortis and is deemed stable for discharge from electrophysiology perspective.  Post device implantation care instruction has been given to the patient.  _____________  Discharge Vitals Blood pressure 112/65, pulse 75, temperature 97.9 F (36.6 C), temperature source Oral, resp. rate 20, height 5\' 6"  (1.676 m), weight 76.8 kg, SpO2 97 %.  Filed Weights   05/21/18 1303 05/22/18 0513  Weight: 77.1 kg 76.8 kg    GEN:No acute distress.   Neck:No JVD Cardiac:RRR, no murmurs, rubs, or gallops. L pectoral incision area clean and dry, covered by glue. No sign of significant hematoma or seroma Respiratory:Clear to auscultation bilaterally. VO:ZDGU, nontender, non-distended  MS:No edema; No deformity. Neuro:Nonfocal  Psych: Normal affect   Labs & Radiologic Studies    CBC Recent Labs    05/21/18 1311  WBC 8.8  HGB 14.7  HCT 45.6  MCV 86.4  PLT 255   Basic Metabolic Panel Recent Labs    44/03/47 1311  NA 139  K 4.1  CL 106  CO2 25  GLUCOSE 88  BUN 12  CREATININE 0.73  CALCIUM 9.5   _____________  Dg Chest 2 View  Result Date:  05/22/2018 CLINICAL DATA:  Cardiac device in situ. EXAM: CHEST - 2 VIEW COMPARISON:  None. FINDINGS: A left subclavian approach pacemaker is in place with leads terminating over the right atrium and right ventricle. A small amount of gas is noted in the adjacent chest wall soft tissues. The cardiomediastinal silhouette is within normal limits. No airspace  consolidation, edema, pleural effusion, or pneumothorax is identified. No acute osseous abnormality is seen. IMPRESSION: Pacemaker placement without evidence of acute airspace disease or pneumothorax. Electronically Signed   By: Sebastian AcheAllen  Grady M.D.   On: 05/22/2018 07:25   Disposition   Pt is being discharged home today in good condition.  Follow-up Plans & Appointments    Follow-up Information    [facility] [address] [facility].   Specialty:  Cardiology Why:  06/03/18 @ 11:00AM, wound check visit Contact information: [address], [address] [place] [id number] [phone]       Duke SalviaKlein, Steven C, MD Follow up.   Specialty:  Cardiology Why:  08/23/18 @ 10:30AM Contact information: [address] [place] [id number] [phone]            Discharge Medications   Allergies as of 05/22/2018   No Known Allergies     Medication List    TAKE these medications   drospirenone-ethinyl estradiol 3-0.02 MG tablet Commonly known as:  YAZ,GIANVI,LORYNA Take 1 tablet by mouth at bedtime.   multivitamin tablet Take 1 tablet by mouth daily.   vitamin B-12 1000 MCG tablet Commonly known as:  CYANOCOBALAMIN Take 1,000 mcg by mouth daily.        Acute coronary syndrome (MI, NSTEMI, STEMI, etc) this admission?: No.    Outstanding Labs/Studies   N/A  Duration of Discharge Encounter   Greater than 30 minutes including physician time.  SignedAzalee Course, Hao Meng, PA 05/22/2018, 8:10 AM    I have seen and examined this patient with Azalee CourseHao Meng.  Agree with above, note added to reflect my findings.  On exam, RRR, no murmurs, lungs clear. S/p [employer] Jude pacemaker for sinus pauses and syncope. CXR and interrogation without issue. Plan for discharge with follow up in device clinic.    Emily Russman M. Sadat Sliwa MD 05/22/2018 8:15 AM

## 2018-05-22 NOTE — Progress Notes (Addendum)
Progress Note  Patient Name: Raynelle DickMelissa Rhinehart Date of Encounter: 05/22/2018  Primary Cardiologist: Sherryl MangesSteven Klein, MD   Subjective   Mild soreness in the incision area, < 5/10, otherwise doing ok.  Inpatient Medications    Scheduled Meds: . drospirenone-ethinyl estradiol  1 tablet Oral QHS  . multivitamin with minerals  1 tablet Oral Daily  . vitamin B-12  1,000 mcg Oral Daily   Continuous Infusions: . sodium chloride Stopped (05/22/18 0328)   PRN Meds: sodium chloride, acetaminophen, ondansetron (ZOFRAN) IV   Vital Signs    Vitals:   05/22/18 0202 05/22/18 0447 05/22/18 0513 05/22/18 0715  BP: 125/68 129/77  112/65  Pulse: 66 67  75  Resp: 14 13  20   Temp:  97.8 F (36.6 C)  97.9 F (36.6 C)  TempSrc:  Oral  Oral  SpO2: 97% 98%  97%  Weight:   76.8 kg   Height:        Intake/Output Summary (Last 24 hours) at 05/22/2018 0744 Last data filed at 05/22/2018 0328 Gross per 24 hour  Intake 260.94 ml  Output 400 ml  Net -139.06 ml   Filed Weights   05/21/18 1303 05/22/18 0513  Weight: 77.1 kg 76.8 kg    Telemetry    NSR with occasionally paced rhythm - Personally Reviewed  ECG    Atrial paced rhythm - Personally Reviewed  Physical Exam   GEN: No acute distress.   Neck: No JVD Cardiac: RRR, no murmurs, rubs, or gallops.  Respiratory: Clear to auscultation bilaterally. GI: Soft, nontender, non-distended  MS: No edema; No deformity. Neuro:  Nonfocal  Psych: Normal affect   Labs    Chemistry Recent Labs  Lab 05/21/18 1311  NA 139  K 4.1  CL 106  CO2 25  GLUCOSE 88  BUN 12  CREATININE 0.73  CALCIUM 9.5  GFRNONAA >60  GFRAA >60  ANIONGAP 8     Hematology Recent Labs  Lab 05/21/18 1311  WBC 8.8  RBC 5.28*  HGB 14.7  HCT 45.6  MCV 86.4  MCH 27.8  MCHC 32.2  RDW 11.9  PLT 255    Cardiac EnzymesNo results for input(s): TROPONINI in the last 168 hours. No results for input(s): TROPIPOC in the last 168 hours.   BNPNo results  for input(s): BNP, PROBNP in the last 168 hours.   DDimer No results for input(s): DDIMER in the last 168 hours.   Radiology    Dg Chest 2 View  Result Date: 05/22/2018 CLINICAL DATA:  Cardiac device in situ. EXAM: CHEST - 2 VIEW COMPARISON:  None. FINDINGS: A left subclavian approach pacemaker is in place with leads terminating over the right atrium and right ventricle. A small amount of gas is noted in the adjacent chest wall soft tissues. The cardiomediastinal silhouette is within normal limits. No airspace consolidation, edema, pleural effusion, or pneumothorax is identified. No acute osseous abnormality is seen. IMPRESSION: Pacemaker placement without evidence of acute airspace disease or pneumothorax. Electronically Signed   By: Sebastian AcheAllen  Grady M.D.   On: 05/22/2018 07:25    Cardiac Studies   Dual Chamber PPM 05/21/2018  LOOP RECORDER REMOVAL  PACEMAKER IMPLANT - Dual Chamber   Medtronic MRI compatible 5076 ventricular lead serial [id number]  Medtronic MRI compatible 5076 atrial lead serial number [id number] BIOTRONIK pulse generator serial number [id number].   Patient Profile     47 y.o. female with h/o prediabetes who presented for PPM after syncope with prolonged sinus asystole. Initial syncope occurred  in the setting of phentermine exposure. Recent loop recorder demonstrated recurrent sinus asystole suggest vagal hypertonia.    Assessment & Plan    1. Syncope 2/2 prolonged sinus asystole  - initially occurred after taking phentermine. LINQ placed, had recurrent symptom and prolonged asystole  - underwent Dual Chamber Biotronik PPM 05/21/2018.   - CXR this morning did not show any complication. Device interrogation is WNL  - plan to discharge, wound check scheduled for 12/12, remote device transmission 12/23, followup with Dr. Graciela Husbands in March.     For questions or updates, please contact [facility] Please consult [email or web address] for contact info under          Signed, Azalee Course, PA  05/22/2018, 7:44 AM    I have seen and examined this patient with Azalee Course.  Agree with above, note added to reflect my findings.  On exam, RRR, no murmurs, lungs clear. Dual chamber pacemaker implanted for sinus arrest and syncope. CXR and interrogation without issue. Plan for discharge with follow up in device clinic.    Diar Berkel M. Taimi Towe MD 05/22/2018 8:02 AM

## 2018-05-24 ENCOUNTER — Encounter (HOSPITAL_COMMUNITY): Payer: Self-pay | Admitting: Internal Medicine

## 2018-06-03 ENCOUNTER — Ambulatory Visit (INDEPENDENT_AMBULATORY_CARE_PROVIDER_SITE_OTHER): Payer: No Typology Code available for payment source | Admitting: *Deleted

## 2018-06-03 DIAGNOSIS — I495 Sick sinus syndrome: Secondary | ICD-10-CM | POA: Diagnosis not present

## 2018-06-03 NOTE — Progress Notes (Signed)
Wound check appointment. Dermabond removed. Wound without redness or edema. Incision edges approximated, wound well healed. Normal device function. Thresholds, sensing, and impedances consistent with implant measurements. Device programmed at 3.5V/auto capture programmed on for extra safety margin until 3 month visit. Histogram distribution appropriate for patient and level of activity. 1 NSVT x 16 beats. Patient educated about wound care, arm mobility, lifting restrictions. ROV 08/23/2018 w/ SK.  [facility], wound well healed, incision edges approximated.

## 2018-06-24 ENCOUNTER — Other Ambulatory Visit: Payer: Self-pay | Admitting: Internal Medicine

## 2018-06-25 ENCOUNTER — Telehealth: Payer: Self-pay | Admitting: Cardiology

## 2018-06-25 NOTE — Telephone Encounter (Signed)
Patient called and stated that she has been feeling anxious. Device Tech RN reviewed remote transmission and I informed patient that she had some sinus tach on 06-22-2018. She wants to know if she needs to do anything to stimulate her vagus nerve or is hers just over stimulated. Dr. Clelia Croft prescribed xanax and she wanted to make sure it is ok to take this w/ her PPM. I informed her that I would send note to MD and his nurse and someone would be in touch with her. She is super anxious and wants to talk to someone today.

## 2018-06-25 NOTE — Telephone Encounter (Signed)
Spoke with pt informed her that taking Xanax would not affect her pacemaker function and that if she is feeling anxious and Dr. Clelia Croft prescribed to her for anxiety than it was ok to take the medication as prescribed. Pt voiced understanding

## 2018-06-27 LAB — CUP PACEART REMOTE DEVICE CHECK
Date Time Interrogation Session: 20191120173657
Implantable Pulse Generator Implant Date: 20190301

## 2018-07-05 NOTE — Telephone Encounter (Signed)
thx UTB

## 2018-08-05 ENCOUNTER — Encounter: Payer: Self-pay | Admitting: Internal Medicine

## 2018-08-23 ENCOUNTER — Ambulatory Visit (INDEPENDENT_AMBULATORY_CARE_PROVIDER_SITE_OTHER): Payer: BLUE CROSS/BLUE SHIELD | Admitting: Internal Medicine

## 2018-08-23 ENCOUNTER — Encounter: Payer: Self-pay | Admitting: Internal Medicine

## 2018-08-23 VITALS — BP 130/88 | HR 82 | Ht 66.0 in | Wt 172.2 lb

## 2018-08-23 DIAGNOSIS — Z950 Presence of cardiac pacemaker: Secondary | ICD-10-CM | POA: Diagnosis not present

## 2018-08-23 DIAGNOSIS — R55 Syncope and collapse: Secondary | ICD-10-CM

## 2018-08-23 DIAGNOSIS — I455 Other specified heart block: Secondary | ICD-10-CM | POA: Diagnosis not present

## 2018-08-23 DIAGNOSIS — I495 Sick sinus syndrome: Secondary | ICD-10-CM

## 2018-08-23 LAB — CUP PACEART INCLINIC DEVICE CHECK
Brady Statistic RV Percent Paced: 0 %
Date Time Interrogation Session: 20200302125524
Implantable Lead Implant Date: 20191129
Implantable Lead Implant Date: 20191129
Implantable Lead Location: 753859
Implantable Lead Location: 753860
Implantable Lead Model: 5076
Implantable Pulse Generator Implant Date: 20191129
Lead Channel Impedance Value: 507 Ohm
Lead Channel Impedance Value: 526 Ohm
Lead Channel Pacing Threshold Amplitude: 0.6 V
Lead Channel Pacing Threshold Amplitude: 0.6 V
Lead Channel Pacing Threshold Pulse Width: 0.4 ms
Lead Channel Pacing Threshold Pulse Width: 0.4 ms
Lead Channel Sensing Intrinsic Amplitude: 11.1 mV
Lead Channel Sensing Intrinsic Amplitude: 9.3 mV
Lead Channel Setting Pacing Amplitude: 2 V
MDC IDC SET LEADCHNL RV PACING AMPLITUDE: 2.4 V
MDC IDC SET LEADCHNL RV PACING PULSEWIDTH: 0.4 ms
MDC IDC STAT BRADY RA PERCENT PACED: 50 %
Pulse Gen Model: 407145
Pulse Gen Serial Number: 69449339

## 2018-08-23 NOTE — Progress Notes (Signed)
Patient Care Team: Martha Clan, MD as PCP - General (Internal Medicine) Duke Salvia, MD as PCP - Cardiology (Cardiology)   HPI  Emily Cook is a 48 y.o. female Seen in follow-up for pacemaker implanted for recurrent syncope with documented pauses of greater than 10 seconds.    These originally occurred temporally in the context of phentermine exposure.  Had no recurrent episodes.  She had three spells on Tues--the first were presyncopal and assoc with nausea.  The last episode occurred while sitting on the floor-- she got up aware that she was presyncopal, sat in a chair and LOC  LINQ>> sinus slowing with prolonged asystole >12 sec  No obvious triggers; no antecedent illness GI or GU symptoms   The below is copied from the initial consultationV12/28/19 she was taking a shower and collapsed in the shower without warning.  Her head went through the drywall.  She recalls no prodromal symptoms and no recovery symptoms.  There is no residual orthostatic intolerance or fatigue.  She ended up going to the hospital where evaluation was negative.  She was discharged.  She went to her home and was walking out with a bag of clothes to go stay with her mom and she collapsed without warning "face planted.  Echocardiogram was normal; stress Myoview was normal ECG was normal.  She was given a 2-week monitor which was normal.   No interval syncope  The patient denies chest pain, shortness of breath, nocturnal dyspnea, orthopnea or peripheral edema.  There have been no palpitations, lightheadedness or syncope.    Records and Results Reviewed   Past Medical History:  Diagnosis Date  . History of prediabetes 06/20/2017  . PCOS (polycystic ovarian syndrome)   . Presence of permanent cardiac pacemaker 05/21/2018  . Syncope and collapse 05/2017; 07/2017; 04/2018    Past Surgical History:  Procedure Laterality Date  . AUGMENTATION MAMMAPLASTY Bilateral 1995  . INSERT / REPLACE  / REMOVE PACEMAKER  05/21/2018  . LACERATION REPAIR Left    eyebrow  . LOOP RECORDER INSERTION N/A 08/21/2017   Procedure: LOOP RECORDER INSERTION;  Surgeon: Duke Salvia, MD;  Location: [facility] INVASIVE CV LAB;  Service: Cardiovascular;  Laterality: N/A;  . LOOP RECORDER REMOVAL  05/21/2018  . LOOP RECORDER REMOVAL N/A 05/21/2018   Procedure: LOOP RECORDER REMOVAL;  Surgeon: Duke Salvia, MD;  [facility]AS[facility] CV LAB;  Service: Cardiovascular;  Laterality: N/A;  . PACEMAKER IMPLANT N/A 05/21/2018   Procedure: PACEMAKER IMPLANT - Dual Chamber;  Surgeon: Duke Salvia, MD;  Location: [facility] INVASIVE CV LAB;  Service: Cardiovascular;  Laterality: N/A;  . TONSILLECTOMY AND ADENOIDECTOMY      Current Meds  Medication Sig  . drospirenone-ethinyl estradiol (YAZ,GIANVI,LORYNA) 3-0.02 MG tablet Take 1 tablet by mouth at bedtime.   . Multiple Vitamin (MULTIVITAMIN) tablet Take 1 tablet by mouth daily.  . vitamin B-12 (CYANOCOBALAMIN) 1000 MCG tablet Take 1,000 mcg by mouth daily.    No Known Allergies    Review of Systems negative except from HPI and PMH  Physical Exam BP 130/88   Pulse 82   Ht 5\' 6"  (1.676 m)   Wt 172 lb 3.2 oz (78.1 kg)   SpO2 99%   BMI 27.79 kg/m  Well developed and nourished in no acute distress HENT normal Neck supple with JVP-flat Clear Regular rate and rhythm, no murmurs or gallops Abd-soft with active BS No Clubbing cyanosis edema Skin-warm and dry A & Oriented  Grossly normal sensory and motor function  ECG  Sinus @ 82  Assessment and plan Syncope with prolonged asystole  Phentermine exposure  PCOS  Pacemaker biotronik  The patient's device was interrogated and the information was fully reviewed.  The device was reprogrammed to maximize longevity   No recurrent syncope  Encouraged full activity and answered the (long ) list of questions  :))       Current medicines are reviewed at length with the patient today .  The patient does  not  have concerns regarding medicines.

## 2018-08-23 NOTE — Patient Instructions (Addendum)
Medication Instructions:  Your physician recommends that you continue on your current medications as directed. Please refer to the Current Medication list given to you today.  Labwork: None ordered.  Testing/Procedures: None ordered.  Follow-Up: Your physician recommends that you schedule a follow-up appointment in:   9 months with Dr. Klein  Any Other Special Instructions Will Be Listed Below (If Applicable).     If you need a refill on your cardiac medications before your next appointment, please call your pharmacy.  

## 2018-11-22 ENCOUNTER — Ambulatory Visit (INDEPENDENT_AMBULATORY_CARE_PROVIDER_SITE_OTHER): Payer: BLUE CROSS/BLUE SHIELD | Admitting: *Deleted

## 2018-11-22 DIAGNOSIS — I495 Sick sinus syndrome: Secondary | ICD-10-CM

## 2018-11-25 LAB — CUP PACEART REMOTE DEVICE CHECK
Battery Remaining Percentage: 95 %
Brady Statistic RA Percent Paced: 48 %
Brady Statistic RV Percent Paced: 0 %
Date Time Interrogation Session: 20200604112236
Implantable Lead Implant Date: 20191129
Implantable Lead Implant Date: 20191129
Implantable Lead Location: 753859
Implantable Lead Location: 753860
Implantable Lead Model: 5076
Implantable Lead Model: 5076
Implantable Pulse Generator Implant Date: 20191129
Lead Channel Impedance Value: 429 Ohm
Lead Channel Impedance Value: 449 Ohm
Lead Channel Pacing Threshold Amplitude: 0.6 V
Lead Channel Pacing Threshold Amplitude: 0.7 V
Lead Channel Pacing Threshold Pulse Width: 0.4 ms
Lead Channel Pacing Threshold Pulse Width: 0.4 ms
Lead Channel Sensing Intrinsic Amplitude: 7 mV
Lead Channel Sensing Intrinsic Amplitude: 9.4 mV
Lead Channel Setting Pacing Amplitude: 2 V
Lead Channel Setting Pacing Amplitude: 2.4 V
Lead Channel Setting Pacing Pulse Width: 0.4 ms
Pulse Gen Model: 407145
Pulse Gen Serial Number: 69449339

## 2018-11-30 ENCOUNTER — Encounter: Payer: Self-pay | Admitting: Cardiology

## 2018-11-30 NOTE — Progress Notes (Signed)
Remote pacemaker transmission.   

## 2019-02-21 ENCOUNTER — Ambulatory Visit (INDEPENDENT_AMBULATORY_CARE_PROVIDER_SITE_OTHER): Payer: BC Managed Care – PPO | Admitting: *Deleted

## 2019-02-21 DIAGNOSIS — I495 Sick sinus syndrome: Secondary | ICD-10-CM | POA: Diagnosis not present

## 2019-02-28 LAB — CUP PACEART REMOTE DEVICE CHECK
Battery Remaining Percentage: 90 %
Brady Statistic RA Percent Paced: 47 %
Brady Statistic RV Percent Paced: 0 %
Date Time Interrogation Session: 20200907200240
Implantable Lead Implant Date: 20191129
Implantable Lead Implant Date: 20191129
Implantable Lead Location: 753859
Implantable Lead Location: 753860
Implantable Lead Model: 5076
Implantable Lead Model: 5076
Implantable Pulse Generator Implant Date: 20191129
Lead Channel Impedance Value: 429 Ohm
Lead Channel Impedance Value: 429 Ohm
Lead Channel Pacing Threshold Amplitude: 0.8 V
Lead Channel Pacing Threshold Pulse Width: 0.4 ms
Lead Channel Sensing Intrinsic Amplitude: 7.8 mV
Lead Channel Sensing Intrinsic Amplitude: 9.1 mV
Lead Channel Setting Pacing Amplitude: 2 V
Lead Channel Setting Pacing Amplitude: 2.4 V
Lead Channel Setting Pacing Pulse Width: 0.4 ms
Pulse Gen Model: 407145
Pulse Gen Serial Number: 69449339

## 2019-03-02 ENCOUNTER — Encounter: Payer: Self-pay | Admitting: Cardiology

## 2019-03-02 ENCOUNTER — Telehealth: Payer: Self-pay | Admitting: Cardiology

## 2019-03-02 DIAGNOSIS — S43102A Unspecified dislocation of left acromioclavicular joint, initial encounter: Secondary | ICD-10-CM | POA: Diagnosis not present

## 2019-03-02 DIAGNOSIS — S4992XA Unspecified injury of left shoulder and upper arm, initial encounter: Secondary | ICD-10-CM | POA: Diagnosis not present

## 2019-03-02 DIAGNOSIS — S46012A Strain of muscle(s) and tendon(s) of the rotator cuff of left shoulder, initial encounter: Secondary | ICD-10-CM | POA: Diagnosis not present

## 2019-03-02 NOTE — Telephone Encounter (Signed)
Pt called and stated that she fell and wanted to get an xray and wanted to make sure there was no contraindications for the PPM. I informed pt that it is ok to get an xray w/ her device. Pt verbalized understanding.

## 2019-03-02 NOTE — Progress Notes (Signed)
Remote pacemaker transmission.   

## 2019-03-24 DIAGNOSIS — M25512 Pain in left shoulder: Secondary | ICD-10-CM | POA: Diagnosis not present

## 2019-03-28 ENCOUNTER — Ambulatory Visit
Admission: RE | Admit: 2019-03-28 | Discharge: 2019-03-28 | Disposition: A | Discharge status: Home / Self Care | Payer: BC Managed Care – PPO | Source: Ambulatory Visit | Attending: Internal Medicine | Admitting: Internal Medicine

## 2019-03-28 ENCOUNTER — Other Ambulatory Visit: Payer: Self-pay

## 2019-03-28 DIAGNOSIS — D2262 Melanocytic nevi of left upper limb, including shoulder: Secondary | ICD-10-CM | POA: Diagnosis not present

## 2019-03-28 DIAGNOSIS — E781 Pure hyperglyceridemia: Secondary | ICD-10-CM | POA: Diagnosis not present

## 2019-03-28 DIAGNOSIS — L821 Other seborrheic keratosis: Secondary | ICD-10-CM | POA: Diagnosis not present

## 2019-03-28 DIAGNOSIS — D2261 Melanocytic nevi of right upper limb, including shoulder: Secondary | ICD-10-CM | POA: Diagnosis not present

## 2019-03-28 DIAGNOSIS — R7301 Impaired fasting glucose: Secondary | ICD-10-CM | POA: Diagnosis not present

## 2019-03-28 DIAGNOSIS — Z0000 Encounter for general adult medical examination without abnormal findings: Secondary | ICD-10-CM | POA: Diagnosis not present

## 2019-03-28 DIAGNOSIS — Z1231 Encounter for screening mammogram for malignant neoplasm of breast: Secondary | ICD-10-CM

## 2019-03-28 DIAGNOSIS — D225 Melanocytic nevi of trunk: Secondary | ICD-10-CM | POA: Diagnosis not present

## 2019-04-07 ENCOUNTER — Other Ambulatory Visit: Payer: Self-pay | Admitting: Internal Medicine

## 2019-04-07 DIAGNOSIS — Z0000 Encounter for general adult medical examination without abnormal findings: Secondary | ICD-10-CM | POA: Diagnosis not present

## 2019-04-07 DIAGNOSIS — Z1231 Encounter for screening mammogram for malignant neoplasm of breast: Secondary | ICD-10-CM

## 2019-04-07 DIAGNOSIS — Z1331 Encounter for screening for depression: Secondary | ICD-10-CM | POA: Diagnosis not present

## 2019-04-07 DIAGNOSIS — R7301 Impaired fasting glucose: Secondary | ICD-10-CM | POA: Diagnosis not present

## 2019-04-07 DIAGNOSIS — R7401 Elevation of levels of liver transaminase levels: Secondary | ICD-10-CM | POA: Diagnosis not present

## 2019-04-07 DIAGNOSIS — Z23 Encounter for immunization: Secondary | ICD-10-CM | POA: Diagnosis not present

## 2019-04-07 DIAGNOSIS — E781 Pure hyperglyceridemia: Secondary | ICD-10-CM | POA: Diagnosis not present

## 2019-04-07 DIAGNOSIS — E282 Polycystic ovarian syndrome: Secondary | ICD-10-CM | POA: Diagnosis not present

## 2019-04-26 ENCOUNTER — Telehealth: Payer: Self-pay

## 2019-04-26 NOTE — Telephone Encounter (Signed)
Pt asked if she can have a MRI. I told her because she has Biotronik ppm with Medtronic leads she can not have a MRI. The pt then asked will it be okay if she have a CT scan. I told her yes she can have one. The pt thanked me for my help.

## 2019-05-04 DIAGNOSIS — M25512 Pain in left shoulder: Secondary | ICD-10-CM | POA: Diagnosis not present

## 2019-05-23 ENCOUNTER — Ambulatory Visit (INDEPENDENT_AMBULATORY_CARE_PROVIDER_SITE_OTHER): Payer: BC Managed Care – PPO | Admitting: *Deleted

## 2019-05-23 DIAGNOSIS — I495 Sick sinus syndrome: Secondary | ICD-10-CM

## 2019-05-23 LAB — CUP PACEART REMOTE DEVICE CHECK
Date Time Interrogation Session: 20201130090925
Implantable Lead Implant Date: 20191129
Implantable Lead Implant Date: 20191129
Implantable Lead Location: 753859
Implantable Lead Location: 753860
Implantable Lead Model: 5076
Implantable Lead Model: 5076
Implantable Pulse Generator Implant Date: 20191129
Pulse Gen Model: 407145
Pulse Gen Serial Number: 69449339

## 2019-05-24 ENCOUNTER — Encounter: Payer: BC Managed Care – PPO | Admitting: Internal Medicine

## 2019-05-26 ENCOUNTER — Other Ambulatory Visit: Payer: Self-pay

## 2019-05-26 ENCOUNTER — Ambulatory Visit (INDEPENDENT_AMBULATORY_CARE_PROVIDER_SITE_OTHER): Payer: BC Managed Care – PPO | Admitting: Internal Medicine

## 2019-05-26 ENCOUNTER — Encounter: Payer: Self-pay | Admitting: Internal Medicine

## 2019-05-26 VITALS — BP 120/74 | HR 79 | Ht 66.0 in | Wt 196.8 lb

## 2019-05-26 DIAGNOSIS — I495 Sick sinus syndrome: Secondary | ICD-10-CM | POA: Diagnosis not present

## 2019-05-26 DIAGNOSIS — Z950 Presence of cardiac pacemaker: Secondary | ICD-10-CM

## 2019-05-26 DIAGNOSIS — I455 Other specified heart block: Secondary | ICD-10-CM | POA: Diagnosis not present

## 2019-05-26 DIAGNOSIS — R55 Syncope and collapse: Secondary | ICD-10-CM

## 2019-05-26 NOTE — Progress Notes (Signed)
Patient Care Team: Martha Clan, MD as PCP - General (Internal Medicine) Duke Salvia, MD as PCP - Cardiology (Cardiology)   HPI  Emily Cook is a 48 y.o. female Seen in follow-up for pacemaker implanted for recurrent syncope with documented pauses of greater than 10 seconds.  These originally occurred temporally in the context of phentermine exposure.  Had no recurrent episodes.  She had three spells on Tues--the first were presyncopal and assoc with nausea.  The last episode occurred while sitting on the floor-- she got up aware that she was presyncopal, sat in a chair and LOC  LINQ>> sinus slowing with prolonged asystole >12 sec  No obvious triggers; no antecedent illness GI or GU symptoms   The below is copied from the initial consultationV12/28/19 she was taking a shower and collapsed in the shower without warning.  Her head went through the drywall.  She recalls no prodromal symptoms and no recovery symptoms.  There is no residual orthostatic intolerance or fatigue.  She ended up going to the hospital where evaluation was negative.  She was discharged.  She went to her home and was walking out with a bag of clothes to go stay with her mom and she collapsed without warning "face planted.  Echocardiogram was normal; stress Myoview was normal ECG was normal.  She was given a 2-week monitor which was normal.  No interval syncope.  She did however fall into a puddle and hurt her shoulder.  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  Significant amount of limitation with her dominant left arm Records and Results Reviewed   Past Medical History:  Diagnosis Date  . History of prediabetes 06/20/2017  . PCOS (polycystic ovarian syndrome)   . Presence of permanent cardiac pacemaker 05/21/2018  . Syncope and collapse 05/2017; 07/2017; 04/2018    Past Surgical History:  Procedure Laterality Date  . AUGMENTATION MAMMAPLASTY Bilateral 1995  . INSERT /  REPLACE / REMOVE PACEMAKER  05/21/2018  . LACERATION REPAIR Left    eyebrow  . LOOP RECORDER INSERTION N/A 08/21/2017   Procedure: LOOP RECORDER INSERTION;  Surgeon: Duke Salvia, MD;  Location[facility] INVASIVE CV LAB;  Service: Cardiovascular;  Laterality: N/A;  . LOOP RECORDER REMOVAL  05/21/2018  . LOOP RECORDER REMOVAL N/A 05/21/2018   Procedure: LOOP RECORDER REMOVAL;  Surgeon: Duke Salvia, MD;  Location: [facility] INVASIVE CV LAB;  Service: Cardiovascular;  Laterality: N/A;  . PACEMAKER IMPLANT N/A 05/21/2018   Procedure: PACEMAKER IMPLANT - Dual Chamber;  Surgeon: Duke Salvia, MD;  Location: [facility] INVASIVE CV LAB;  Service: Cardiovascular;  Laterality: N/A;  . TONSILLECTOMY AND ADENOIDECTOMY      Current Meds  Medication Sig  . drospirenone-ethinyl estradiol (YAZ,GIANVI,LORYNA) 3-0.02 MG tablet Take 1 tablet by mouth at bedtime.   . Multiple Vitamin (MULTIVITAMIN) tablet Take 1 tablet by mouth daily.  . vitamin B-12 (CYANOCOBALAMIN) 1000 MCG tablet Take 1,000 mcg by mouth daily.    No Known Allergies    Review of Systems negative except from HPI and PMH  Physical Exam BP 120/74   Ht 5\' 6"  (1.676 m)   Wt 196 lb 12.8 oz (89.3 kg)   BMI 31.76 kg/m  Well developed and well nourished in no acute distress HENT normal Neck supple with JVP-flat Clear Device pocket well healed; without hematoma or erythema.  There is no tethering  Regular rate and rhythm, no  murmur Abd-soft with active BS No Clubbing  cyanosis  * edema Skin-warm and dry A & Oriented  Grossly normal sensory and motor function major limitation in left arm motion  ECG ECG demonstrates sinus rhythm at 79 Intervals 14/08/37  Assessment and plan Syncope with prolonged asystole  Phentermine exposure  PCOS  Pacemaker biotronik    The patient's device was interrogated.  The information was reviewed. No changes were made in the programming.     Frozen shoulder  Encouraged her to pursue physical therapy.  She  asked about MRIs.  There is an issue from some perspectives about this matching assistance.  I would have no problem with her having an MRI.   Current medicines are reviewed at length with the patient today .  The patient does not  have concerns regarding medicines.

## 2019-05-26 NOTE — Patient Instructions (Signed)
Medication Instructions:  Your physician recommends that you continue on your current medications as directed. Please refer to the Current Medication list given to you today.   Labwork: None ordered.   Testing/Procedures: None ordered.   Follow-Up: Your physician recommends that you schedule a follow-up appointment in: 1 year with Dr Caryl Comes   Any Other Special Instructions Will Be Listed Below (If Applicable).     If you need a refill on your cardiac medications before your next appointment, please call your pharmacy.

## 2019-06-15 NOTE — Progress Notes (Signed)
PPM remote 

## 2019-07-04 DIAGNOSIS — M25512 Pain in left shoulder: Secondary | ICD-10-CM | POA: Diagnosis not present

## 2019-08-22 ENCOUNTER — Ambulatory Visit (INDEPENDENT_AMBULATORY_CARE_PROVIDER_SITE_OTHER): Payer: BC Managed Care – PPO | Admitting: *Deleted

## 2019-08-22 DIAGNOSIS — I495 Sick sinus syndrome: Secondary | ICD-10-CM | POA: Diagnosis not present

## 2019-08-22 LAB — CUP PACEART REMOTE DEVICE CHECK
Date Time Interrogation Session: 20210301082705
Implantable Lead Implant Date: 20191129
Implantable Lead Implant Date: 20191129
Implantable Lead Location: 753859
Implantable Lead Location: 753860
Implantable Lead Model: 5076
Implantable Lead Model: 5076
Implantable Pulse Generator Implant Date: 20191129
Pulse Gen Model: 407145
Pulse Gen Serial Number: 69449339

## 2019-08-22 NOTE — Progress Notes (Signed)
PPM Remote  

## 2019-10-07 DIAGNOSIS — M7502 Adhesive capsulitis of left shoulder: Secondary | ICD-10-CM | POA: Diagnosis not present

## 2019-10-07 DIAGNOSIS — M25512 Pain in left shoulder: Secondary | ICD-10-CM | POA: Diagnosis not present

## 2019-11-23 ENCOUNTER — Ambulatory Visit (INDEPENDENT_AMBULATORY_CARE_PROVIDER_SITE_OTHER): Payer: BC Managed Care – PPO | Admitting: *Deleted

## 2019-11-23 DIAGNOSIS — I495 Sick sinus syndrome: Secondary | ICD-10-CM | POA: Diagnosis not present

## 2019-11-24 LAB — CUP PACEART REMOTE DEVICE CHECK
Date Time Interrogation Session: 20210603072826
Implantable Lead Implant Date: 20191129
Implantable Lead Implant Date: 20191129
Implantable Lead Location: 753859
Implantable Lead Location: 753860
Implantable Lead Model: 5076
Implantable Lead Model: 5076
Implantable Pulse Generator Implant Date: 20191129
Pulse Gen Model: 407145
Pulse Gen Serial Number: 69449339

## 2019-11-28 NOTE — Progress Notes (Signed)
Remote pacemaker transmission.   

## 2019-12-06 DIAGNOSIS — M7502 Adhesive capsulitis of left shoulder: Secondary | ICD-10-CM | POA: Diagnosis not present

## 2020-01-17 DIAGNOSIS — M7502 Adhesive capsulitis of left shoulder: Secondary | ICD-10-CM | POA: Diagnosis not present

## 2020-02-20 ENCOUNTER — Telehealth: Payer: Self-pay

## 2020-02-20 NOTE — Telephone Encounter (Signed)
The pt states her old insurance runs out on 02-21-2020 and has a new insurance card she needs to add that starts on 02-22-2020. She tried to add it on my-chart but was unsuccessful. I told her I would send it to the billing department to help her. I do not know how to add insurance to a pt chart and billing know more about the insurance side of it. I told her I would have them give her a call back. Her phone number is [phone]

## 2020-02-22 ENCOUNTER — Ambulatory Visit (INDEPENDENT_AMBULATORY_CARE_PROVIDER_SITE_OTHER): Payer: BLUE CROSS/BLUE SHIELD | Admitting: *Deleted

## 2020-02-22 DIAGNOSIS — I495 Sick sinus syndrome: Secondary | ICD-10-CM

## 2020-02-22 LAB — CUP PACEART REMOTE DEVICE CHECK
Date Time Interrogation Session: 20210901101827
Implantable Lead Implant Date: 20191129
Implantable Lead Implant Date: 20191129
Implantable Lead Location: 753859
Implantable Lead Location: 753860
Implantable Lead Model: 5076
Implantable Lead Model: 5076
Implantable Pulse Generator Implant Date: 20191129
Lead Channel Setting Pacing Amplitude: 2 V
Lead Channel Setting Pacing Amplitude: 2.4 V
Lead Channel Setting Pacing Pulse Width: 0.4 ms
Pulse Gen Model: 407145
Pulse Gen Serial Number: 69449339

## 2020-02-24 NOTE — Progress Notes (Signed)
Remote pacemaker transmission.   

## 2020-02-28 DIAGNOSIS — M7502 Adhesive capsulitis of left shoulder: Secondary | ICD-10-CM | POA: Diagnosis not present

## 2020-03-28 ENCOUNTER — Ambulatory Visit
Admission: RE | Admit: 2020-03-28 | Discharge: 2020-03-28 | Disposition: A | Discharge status: Home / Self Care | Payer: BLUE CROSS/BLUE SHIELD | Source: Ambulatory Visit | Attending: Internal Medicine | Admitting: Internal Medicine

## 2020-03-28 ENCOUNTER — Other Ambulatory Visit: Payer: Self-pay

## 2020-03-28 ENCOUNTER — Other Ambulatory Visit: Payer: Self-pay | Admitting: Internal Medicine

## 2020-03-28 DIAGNOSIS — R7301 Impaired fasting glucose: Secondary | ICD-10-CM | POA: Diagnosis not present

## 2020-03-28 DIAGNOSIS — Z1231 Encounter for screening mammogram for malignant neoplasm of breast: Secondary | ICD-10-CM | POA: Diagnosis not present

## 2020-03-28 DIAGNOSIS — Z0000 Encounter for general adult medical examination without abnormal findings: Secondary | ICD-10-CM

## 2020-03-28 DIAGNOSIS — D1801 Hemangioma of skin and subcutaneous tissue: Secondary | ICD-10-CM | POA: Diagnosis not present

## 2020-03-28 DIAGNOSIS — D2372 Other benign neoplasm of skin of left lower limb, including hip: Secondary | ICD-10-CM | POA: Diagnosis not present

## 2020-03-28 DIAGNOSIS — D225 Melanocytic nevi of trunk: Secondary | ICD-10-CM | POA: Diagnosis not present

## 2020-03-28 DIAGNOSIS — L821 Other seborrheic keratosis: Secondary | ICD-10-CM | POA: Diagnosis not present

## 2020-04-02 ENCOUNTER — Other Ambulatory Visit: Payer: Self-pay | Admitting: Internal Medicine

## 2020-04-02 DIAGNOSIS — R928 Other abnormal and inconclusive findings on diagnostic imaging of breast: Secondary | ICD-10-CM

## 2020-04-12 ENCOUNTER — Other Ambulatory Visit: Payer: Self-pay | Admitting: Internal Medicine

## 2020-04-12 ENCOUNTER — Ambulatory Visit
Admission: RE | Admit: 2020-04-12 | Discharge: 2020-04-12 | Disposition: A | Discharge status: Home / Self Care | Payer: BLUE CROSS/BLUE SHIELD | Source: Ambulatory Visit | Attending: Internal Medicine | Admitting: Internal Medicine

## 2020-04-12 ENCOUNTER — Other Ambulatory Visit: Payer: Self-pay

## 2020-04-12 DIAGNOSIS — R928 Other abnormal and inconclusive findings on diagnostic imaging of breast: Secondary | ICD-10-CM

## 2020-04-12 DIAGNOSIS — E781 Pure hyperglyceridemia: Secondary | ICD-10-CM | POA: Diagnosis not present

## 2020-04-12 DIAGNOSIS — N6489 Other specified disorders of breast: Secondary | ICD-10-CM | POA: Diagnosis not present

## 2020-04-12 DIAGNOSIS — R921 Mammographic calcification found on diagnostic imaging of breast: Secondary | ICD-10-CM | POA: Diagnosis not present

## 2020-04-12 DIAGNOSIS — Z1331 Encounter for screening for depression: Secondary | ICD-10-CM | POA: Diagnosis not present

## 2020-04-12 DIAGNOSIS — R82998 Other abnormal findings in urine: Secondary | ICD-10-CM | POA: Diagnosis not present

## 2020-04-12 DIAGNOSIS — R7301 Impaired fasting glucose: Secondary | ICD-10-CM | POA: Diagnosis not present

## 2020-04-12 DIAGNOSIS — Z0000 Encounter for general adult medical examination without abnormal findings: Secondary | ICD-10-CM | POA: Diagnosis not present

## 2020-04-12 DIAGNOSIS — Z1389 Encounter for screening for other disorder: Secondary | ICD-10-CM | POA: Diagnosis not present

## 2020-05-23 ENCOUNTER — Ambulatory Visit (INDEPENDENT_AMBULATORY_CARE_PROVIDER_SITE_OTHER): Payer: BLUE CROSS/BLUE SHIELD

## 2020-05-23 DIAGNOSIS — I495 Sick sinus syndrome: Secondary | ICD-10-CM

## 2020-05-25 LAB — CUP PACEART REMOTE DEVICE CHECK
Date Time Interrogation Session: 20211202095407
Implantable Lead Implant Date: 20191129
Implantable Lead Implant Date: 20191129
Implantable Lead Location: 753859
Implantable Lead Location: 753860
Implantable Lead Model: 5076
Implantable Lead Model: 5076
Implantable Pulse Generator Implant Date: 20191129
Pulse Gen Model: 407145
Pulse Gen Serial Number: 69449339

## 2020-05-30 NOTE — Progress Notes (Signed)
Remote pacemaker transmission.   

## 2020-07-15 DIAGNOSIS — U071 COVID-19: Secondary | ICD-10-CM | POA: Diagnosis not present

## 2020-07-15 DIAGNOSIS — R519 Headache, unspecified: Secondary | ICD-10-CM | POA: Diagnosis not present

## 2020-07-15 DIAGNOSIS — Z20822 Contact with and (suspected) exposure to covid-19: Secondary | ICD-10-CM | POA: Diagnosis not present

## 2020-07-15 DIAGNOSIS — R509 Fever, unspecified: Secondary | ICD-10-CM | POA: Diagnosis not present

## 2020-07-19 DIAGNOSIS — Z8616 Personal history of COVID-19: Secondary | ICD-10-CM | POA: Diagnosis not present

## 2020-07-19 DIAGNOSIS — U071 COVID-19: Secondary | ICD-10-CM | POA: Diagnosis not present

## 2020-07-19 DIAGNOSIS — R059 Cough, unspecified: Secondary | ICD-10-CM | POA: Diagnosis not present

## 2020-07-23 DIAGNOSIS — Z8616 Personal history of COVID-19: Secondary | ICD-10-CM | POA: Diagnosis not present

## 2020-07-24 ENCOUNTER — Telehealth: Payer: Self-pay

## 2020-07-24 NOTE — Telephone Encounter (Signed)
Patient called in and states her charging cable is broke to her home monitor and I have reached out to rep Kipp Brood) and he is going to send her out a new one in the mail. Patient aware and will plug it in as soon as she gets it.

## 2020-08-22 ENCOUNTER — Ambulatory Visit (INDEPENDENT_AMBULATORY_CARE_PROVIDER_SITE_OTHER): Payer: BLUE CROSS/BLUE SHIELD

## 2020-08-22 DIAGNOSIS — I495 Sick sinus syndrome: Secondary | ICD-10-CM

## 2020-08-24 LAB — CUP PACEART REMOTE DEVICE CHECK
Date Time Interrogation Session: 20220302091028
Implantable Lead Implant Date: 20191129
Implantable Lead Implant Date: 20191129
Implantable Lead Location: 753859
Implantable Lead Location: 753860
Implantable Lead Model: 5076
Implantable Lead Model: 5076
Implantable Pulse Generator Implant Date: 20191129
Pulse Gen Model: 407145
Pulse Gen Serial Number: 69449339

## 2020-08-31 NOTE — Progress Notes (Signed)
Remote pacemaker transmission.   

## 2020-11-21 ENCOUNTER — Ambulatory Visit (INDEPENDENT_AMBULATORY_CARE_PROVIDER_SITE_OTHER): Payer: BLUE CROSS/BLUE SHIELD

## 2020-11-21 DIAGNOSIS — I495 Sick sinus syndrome: Secondary | ICD-10-CM | POA: Diagnosis not present

## 2020-11-22 LAB — CUP PACEART REMOTE DEVICE CHECK
Date Time Interrogation Session: 20220601080925
Implantable Lead Implant Date: 20191129
Implantable Lead Implant Date: 20191129
Implantable Lead Location: 753859
Implantable Lead Location: 753860
Implantable Lead Model: 5076
Implantable Lead Model: 5076
Implantable Pulse Generator Implant Date: 20191129
Pulse Gen Model: 407145
Pulse Gen Serial Number: 69449339

## 2020-12-14 NOTE — Progress Notes (Signed)
Remote pacemaker transmission.   

## 2021-02-20 ENCOUNTER — Ambulatory Visit (INDEPENDENT_AMBULATORY_CARE_PROVIDER_SITE_OTHER): Payer: BLUE CROSS/BLUE SHIELD

## 2021-02-20 DIAGNOSIS — I495 Sick sinus syndrome: Secondary | ICD-10-CM | POA: Diagnosis not present

## 2021-02-26 LAB — CUP PACEART REMOTE DEVICE CHECK
Battery Remaining Percentage: 75 %
Brady Statistic RA Percent Paced: 38 %
Brady Statistic RV Percent Paced: 0 %
Date Time Interrogation Session: 20220830180159
Implantable Lead Implant Date: 20191129
Implantable Lead Implant Date: 20191129
Implantable Lead Location: 753859
Implantable Lead Location: 753860
Implantable Lead Model: 5076
Implantable Lead Model: 5076
Implantable Pulse Generator Implant Date: 20191129
Lead Channel Impedance Value: 390 Ohm
Lead Channel Impedance Value: 449 Ohm
Lead Channel Pacing Threshold Amplitude: 0.5 V
Lead Channel Pacing Threshold Amplitude: 0.8 V
Lead Channel Pacing Threshold Pulse Width: 0.4 ms
Lead Channel Pacing Threshold Pulse Width: 0.4 ms
Lead Channel Sensing Intrinsic Amplitude: 4.1 mV
Lead Channel Sensing Intrinsic Amplitude: 9 mV
Lead Channel Setting Pacing Amplitude: 2 V
Lead Channel Setting Pacing Amplitude: 2.4 V
Lead Channel Setting Pacing Pulse Width: 0.4 ms
Pulse Gen Model: 407145
Pulse Gen Serial Number: 69449339

## 2021-03-05 NOTE — Progress Notes (Signed)
Remote pacemaker transmission.   

## 2021-04-11 IMAGING — MG MM DIGITAL DIAGNOSTIC UNILAT*R* W/ TOMO W/ CAD
6 of 9 series · 6 of 21 positions shown · non-contrast
Comparison: Previous exam(s).

CLINICAL DATA: 49-year-old female presenting as a recall from
screening for possible right breast calcifications and possible
right breast distortion.

EXAM:
DIGITAL DIAGNOSTIC RIGHT MAMMOGRAM WITH TOMO
ULTRASOUND RIGHT BREAST

[R ML (1 of 2)]
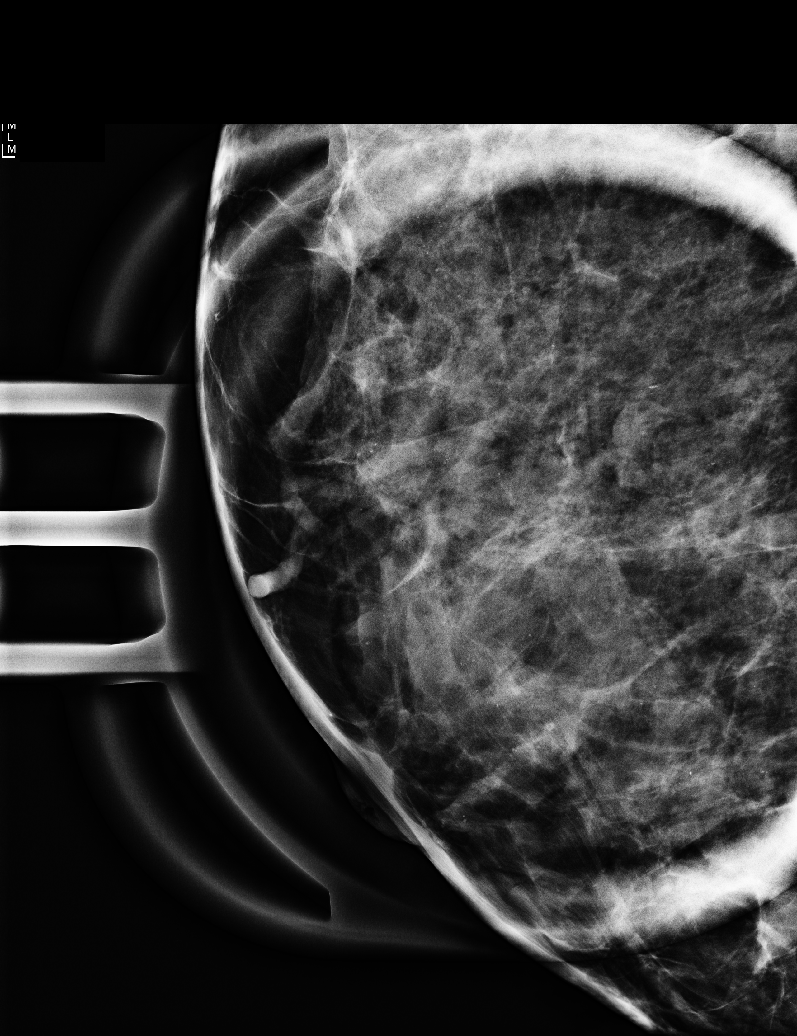

[R CC]
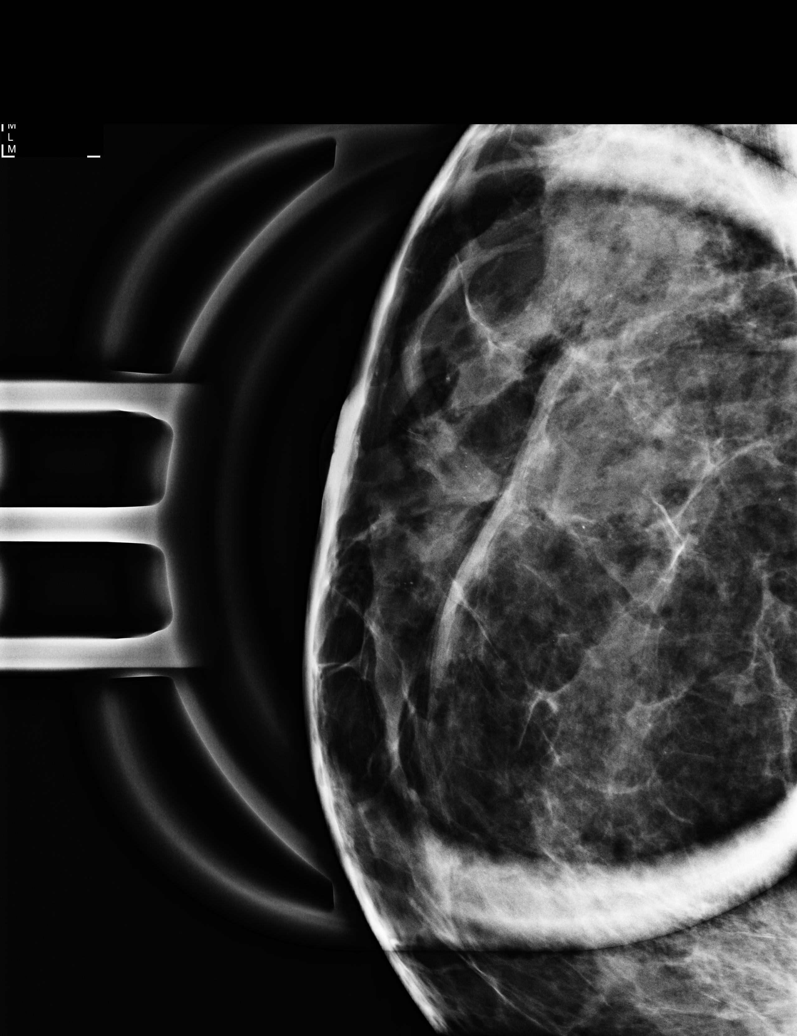

[R ML (2 of 2)]
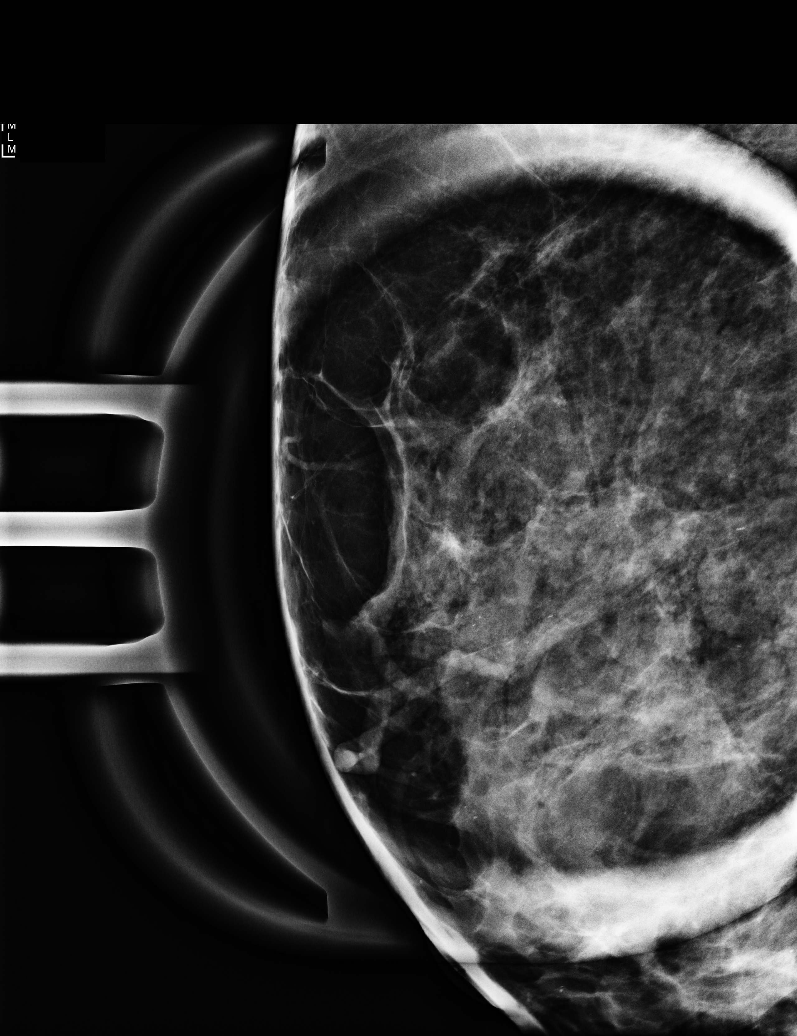

[R MLO synth-2D]
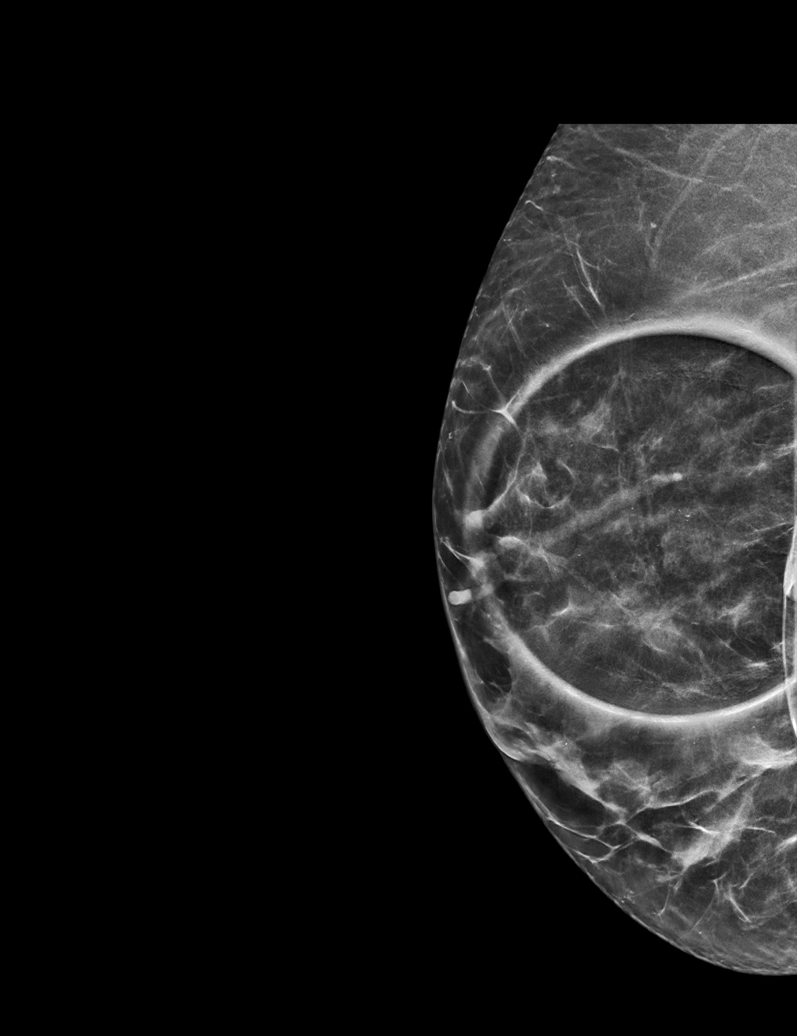

[R XCCL synth-2D]
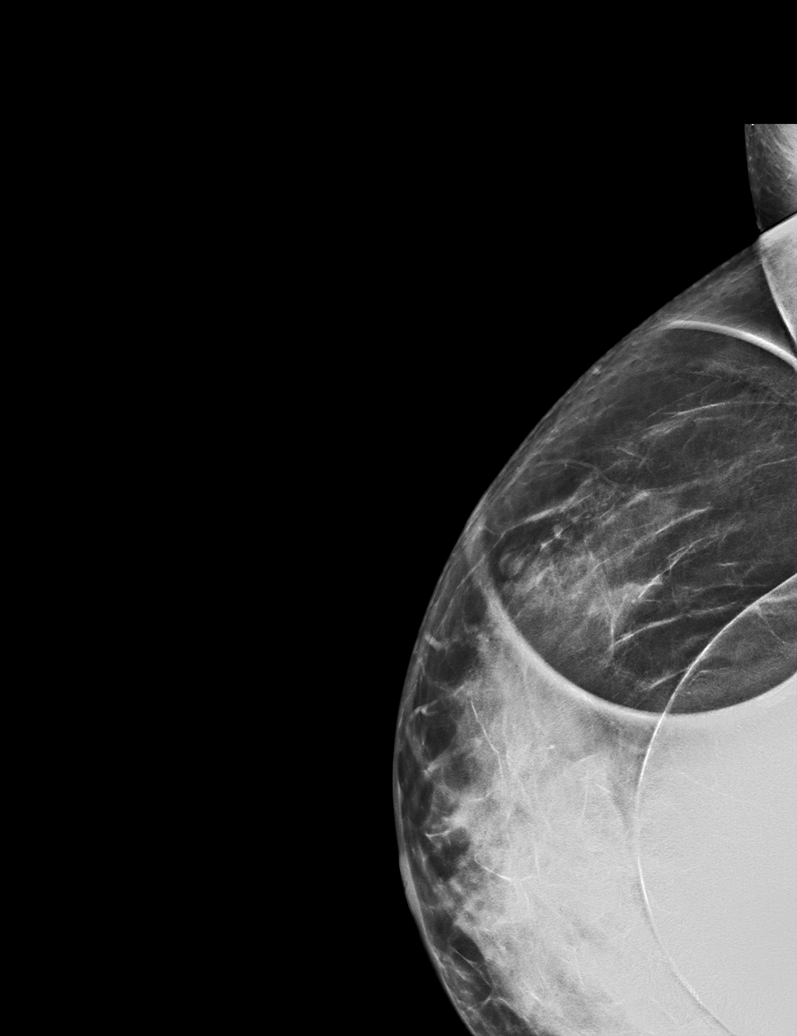

[R ML synth-2D]
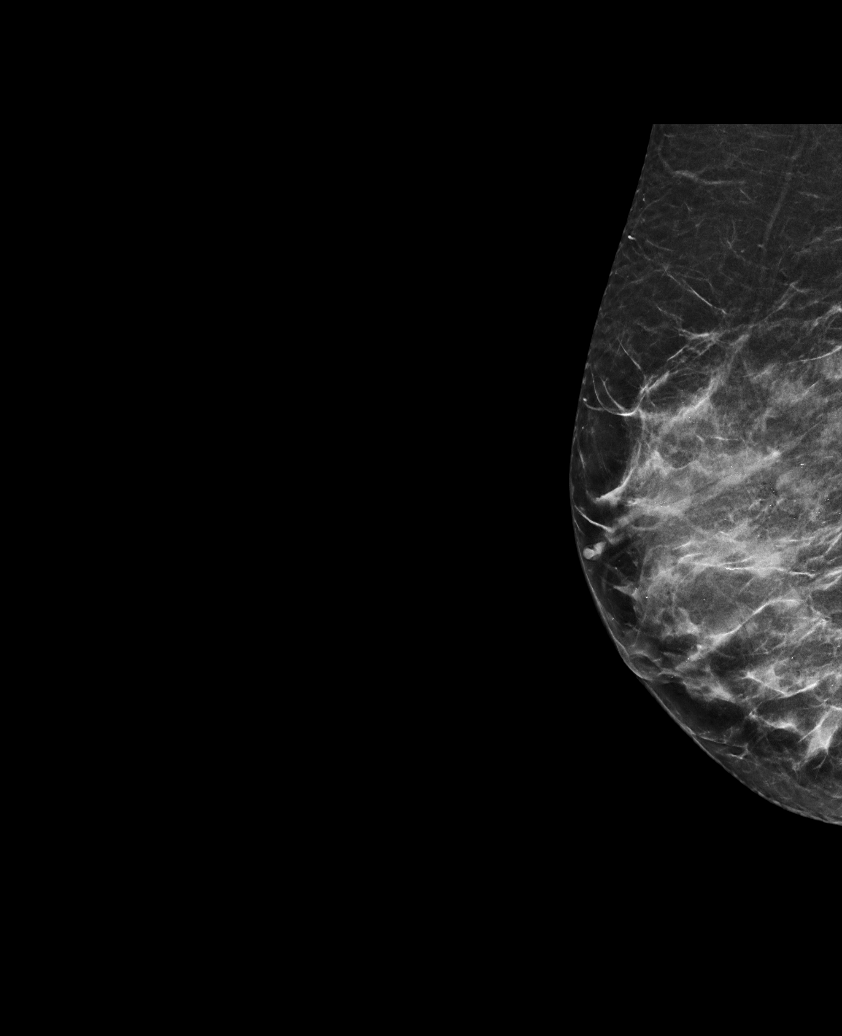

[6 of 21 positions shown; findings below may reference images not displayed]

ACR Breast Density Category c: The breast tissue is heterogeneously
dense, which may obscure small masses.
FINDINGS: Mammogram:

Spot 2D magnification, spot tomosynthesis and full field mL
tomosynthesis views of the right breast were performed.

There are diffuse scattered punctate and amorphous calcifications
throughout the anterior aspect of the right breast, several of which
layer on lateral view, consistent with benign milk of calcium. There
are no suspicious linear or branching forms.

Additional imaging of the upper-outer quadrant the right breast
demonstrates no persistent distortion, mass or asymmetry.

There are no new findings in the right breast.

Ultrasound:

Targeted ultrasound is performed throughout the upper-outer quadrant
of the right breast demonstrating no suspicious cystic or solid mass
or focal area of shadowing.
IMPRESSION: 1. Diffuse scattered calcifications throughout the anterior right
breast, several of which layer on lateral view, consistent with
benign milk of calcium.

2. No persistent distortion in the upper-outer quadrant of the right
breast.

RECOMMENDATION:
Screening mammogram in one year.(Code:[id number])

I have discussed the findings and recommendations with the patient.
If applicable, a reminder letter will be sent to the patient
regarding the next appointment.

BI-RADS CATEGORY  2: Benign.

## 2021-05-22 ENCOUNTER — Ambulatory Visit (INDEPENDENT_AMBULATORY_CARE_PROVIDER_SITE_OTHER): Payer: BLUE CROSS/BLUE SHIELD

## 2021-05-22 DIAGNOSIS — Z950 Presence of cardiac pacemaker: Secondary | ICD-10-CM

## 2021-05-22 DIAGNOSIS — I495 Sick sinus syndrome: Secondary | ICD-10-CM

## 2021-05-22 LAB — CUP PACEART REMOTE DEVICE CHECK
Date Time Interrogation Session: 20221130074824
Implantable Lead Implant Date: 20191129
Implantable Lead Implant Date: 20191129
Implantable Lead Location: 753859
Implantable Lead Location: 753860
Implantable Lead Model: 5076
Implantable Lead Model: 5076
Implantable Pulse Generator Implant Date: 20191129
Pulse Gen Model: 407145
Pulse Gen Serial Number: 69449339

## 2021-05-31 NOTE — Progress Notes (Signed)
Remote pacemaker transmission.   

## 2021-08-21 ENCOUNTER — Ambulatory Visit (INDEPENDENT_AMBULATORY_CARE_PROVIDER_SITE_OTHER): Payer: BLUE CROSS/BLUE SHIELD

## 2021-08-21 DIAGNOSIS — I495 Sick sinus syndrome: Secondary | ICD-10-CM | POA: Diagnosis not present

## 2021-08-22 LAB — CUP PACEART REMOTE DEVICE CHECK
Date Time Interrogation Session: 20230302100104
Implantable Lead Implant Date: 20191129
Implantable Lead Implant Date: 20191129
Implantable Lead Location: 753859
Implantable Lead Location: 753860
Implantable Lead Model: 5076
Implantable Lead Model: 5076
Implantable Pulse Generator Implant Date: 20191129
Pulse Gen Model: 407145
Pulse Gen Serial Number: 69449339

## 2021-08-28 NOTE — Progress Notes (Signed)
Remote pacemaker transmission.   

## 2021-10-14 ENCOUNTER — Other Ambulatory Visit: Payer: Self-pay | Admitting: Internal Medicine

## 2021-10-14 DIAGNOSIS — Z1231 Encounter for screening mammogram for malignant neoplasm of breast: Secondary | ICD-10-CM

## 2021-10-22 ENCOUNTER — Ambulatory Visit
Admission: RE | Admit: 2021-10-22 | Discharge: 2021-10-22 | Disposition: A | Discharge status: Home / Self Care | Payer: BLUE CROSS/BLUE SHIELD | Source: Ambulatory Visit | Attending: Internal Medicine | Admitting: Internal Medicine

## 2021-10-22 DIAGNOSIS — Z1231 Encounter for screening mammogram for malignant neoplasm of breast: Secondary | ICD-10-CM | POA: Diagnosis not present

## 2021-11-19 DIAGNOSIS — R109 Unspecified abdominal pain: Secondary | ICD-10-CM | POA: Diagnosis not present

## 2021-11-20 ENCOUNTER — Ambulatory Visit (INDEPENDENT_AMBULATORY_CARE_PROVIDER_SITE_OTHER): Payer: BLUE CROSS/BLUE SHIELD

## 2021-11-20 DIAGNOSIS — I495 Sick sinus syndrome: Secondary | ICD-10-CM | POA: Diagnosis not present

## 2021-11-21 LAB — CUP PACEART REMOTE DEVICE CHECK
Date Time Interrogation Session: 20230530122322
Implantable Lead Implant Date: 20191129
Implantable Lead Implant Date: 20191129
Implantable Lead Location: 753859
Implantable Lead Location: 753860
Implantable Lead Model: 5076
Implantable Lead Model: 5076
Implantable Pulse Generator Implant Date: 20191129
Pulse Gen Model: 407145
Pulse Gen Serial Number: 69449339

## 2021-12-04 NOTE — Progress Notes (Signed)
Remote pacemaker transmission.   

## 2021-12-26 ENCOUNTER — Other Ambulatory Visit: Payer: Self-pay | Admitting: Internal Medicine

## 2021-12-26 DIAGNOSIS — R197 Diarrhea, unspecified: Secondary | ICD-10-CM | POA: Diagnosis not present

## 2021-12-26 DIAGNOSIS — R112 Nausea with vomiting, unspecified: Secondary | ICD-10-CM | POA: Diagnosis not present

## 2021-12-26 DIAGNOSIS — Z1331 Encounter for screening for depression: Secondary | ICD-10-CM | POA: Diagnosis not present

## 2021-12-31 ENCOUNTER — Ambulatory Visit
Admission: RE | Admit: 2021-12-31 | Discharge: 2021-12-31 | Disposition: A | Discharge status: Home / Self Care | Payer: BLUE CROSS/BLUE SHIELD | Source: Ambulatory Visit | Attending: Internal Medicine | Admitting: Internal Medicine

## 2021-12-31 DIAGNOSIS — D2262 Melanocytic nevi of left upper limb, including shoulder: Secondary | ICD-10-CM | POA: Diagnosis not present

## 2021-12-31 DIAGNOSIS — R112 Nausea with vomiting, unspecified: Secondary | ICD-10-CM | POA: Diagnosis not present

## 2021-12-31 DIAGNOSIS — L820 Inflamed seborrheic keratosis: Secondary | ICD-10-CM | POA: Diagnosis not present

## 2021-12-31 DIAGNOSIS — D2372 Other benign neoplasm of skin of left lower limb, including hip: Secondary | ICD-10-CM | POA: Diagnosis not present

## 2021-12-31 DIAGNOSIS — D692 Other nonthrombocytopenic purpura: Secondary | ICD-10-CM | POA: Diagnosis not present

## 2021-12-31 DIAGNOSIS — D2261 Melanocytic nevi of right upper limb, including shoulder: Secondary | ICD-10-CM | POA: Diagnosis not present

## 2022-02-19 ENCOUNTER — Ambulatory Visit (INDEPENDENT_AMBULATORY_CARE_PROVIDER_SITE_OTHER): Payer: BLUE CROSS/BLUE SHIELD

## 2022-02-19 DIAGNOSIS — I495 Sick sinus syndrome: Secondary | ICD-10-CM | POA: Diagnosis not present

## 2022-02-19 LAB — CUP PACEART REMOTE DEVICE CHECK
Battery Remaining Percentage: 70 %
Brady Statistic RA Percent Paced: 41 %
Brady Statistic RV Percent Paced: 0 %
Date Time Interrogation Session: 20230830083029
Implantable Lead Implant Date: 20191129
Implantable Lead Implant Date: 20191129
Implantable Lead Location: 753859
Implantable Lead Location: 753860
Implantable Lead Model: 5076
Implantable Lead Model: 5076
Implantable Pulse Generator Implant Date: 20191129
Lead Channel Impedance Value: 371 Ohm
Lead Channel Impedance Value: 410 Ohm
Lead Channel Pacing Threshold Amplitude: 1 V
Lead Channel Pacing Threshold Pulse Width: 0.4 ms
Lead Channel Sensing Intrinsic Amplitude: 3.5 mV
Lead Channel Sensing Intrinsic Amplitude: 6.8 mV
Lead Channel Setting Pacing Amplitude: 2 V
Lead Channel Setting Pacing Amplitude: 2.4 V
Lead Channel Setting Pacing Pulse Width: 0.4 ms
Pulse Gen Model: 407145
Pulse Gen Serial Number: 69449339

## 2022-03-07 ENCOUNTER — Encounter: Payer: Self-pay | Admitting: Internal Medicine

## 2022-03-14 NOTE — Progress Notes (Signed)
Remote pacemaker transmission.   

## 2022-03-19 DIAGNOSIS — R7301 Impaired fasting glucose: Secondary | ICD-10-CM | POA: Diagnosis not present

## 2022-03-19 DIAGNOSIS — E781 Pure hyperglyceridemia: Secondary | ICD-10-CM | POA: Diagnosis not present

## 2022-03-25 DIAGNOSIS — Z1339 Encounter for screening examination for other mental health and behavioral disorders: Secondary | ICD-10-CM | POA: Diagnosis not present

## 2022-03-25 DIAGNOSIS — E781 Pure hyperglyceridemia: Secondary | ICD-10-CM | POA: Diagnosis not present

## 2022-03-25 DIAGNOSIS — Z1331 Encounter for screening for depression: Secondary | ICD-10-CM | POA: Diagnosis not present

## 2022-03-25 DIAGNOSIS — R82998 Other abnormal findings in urine: Secondary | ICD-10-CM | POA: Diagnosis not present

## 2022-03-25 DIAGNOSIS — Z0000 Encounter for general adult medical examination without abnormal findings: Secondary | ICD-10-CM | POA: Diagnosis not present

## 2022-03-25 DIAGNOSIS — Z1151 Encounter for screening for human papillomavirus (HPV): Secondary | ICD-10-CM | POA: Diagnosis not present

## 2022-03-25 DIAGNOSIS — Z23 Encounter for immunization: Secondary | ICD-10-CM | POA: Diagnosis not present

## 2022-05-21 ENCOUNTER — Ambulatory Visit: Payer: BLUE CROSS/BLUE SHIELD

## 2022-05-23 LAB — CUP PACEART REMOTE DEVICE CHECK
Date Time Interrogation Session: 20231130073515
Implantable Lead Connection Status: 753985
Implantable Lead Connection Status: 753985
Implantable Lead Implant Date: 20191129
Implantable Lead Implant Date: 20191129
Implantable Lead Location: 753859
Implantable Lead Location: 753860
Implantable Lead Model: 5076
Implantable Lead Model: 5076
Implantable Pulse Generator Implant Date: 20191129
Pulse Gen Model: 407145
Pulse Gen Serial Number: 69449339

## 2022-08-15 DIAGNOSIS — R0982 Postnasal drip: Secondary | ICD-10-CM | POA: Diagnosis not present

## 2022-08-15 DIAGNOSIS — U071 COVID-19: Secondary | ICD-10-CM | POA: Diagnosis not present

## 2022-08-15 DIAGNOSIS — R059 Cough, unspecified: Secondary | ICD-10-CM | POA: Diagnosis not present

## 2022-08-20 ENCOUNTER — Ambulatory Visit (INDEPENDENT_AMBULATORY_CARE_PROVIDER_SITE_OTHER): Payer: BLUE CROSS/BLUE SHIELD

## 2022-08-20 DIAGNOSIS — I495 Sick sinus syndrome: Secondary | ICD-10-CM | POA: Diagnosis not present

## 2022-08-21 LAB — CUP PACEART REMOTE DEVICE CHECK
Date Time Interrogation Session: 20240229074717
Implantable Lead Connection Status: 753985
Implantable Lead Connection Status: 753985
Implantable Lead Implant Date: 20191129
Implantable Lead Implant Date: 20191129
Implantable Lead Location: 753859
Implantable Lead Location: 753860
Implantable Lead Model: 5076
Implantable Lead Model: 5076
Implantable Pulse Generator Implant Date: 20191129
Pulse Gen Model: 407145
Pulse Gen Serial Number: 69449339

## 2022-09-24 NOTE — Progress Notes (Signed)
Remote pacemaker transmission.   

## 2022-11-19 ENCOUNTER — Ambulatory Visit (INDEPENDENT_AMBULATORY_CARE_PROVIDER_SITE_OTHER): Payer: BLUE CROSS/BLUE SHIELD

## 2022-11-19 DIAGNOSIS — I495 Sick sinus syndrome: Secondary | ICD-10-CM | POA: Diagnosis not present

## 2022-11-20 LAB — CUP PACEART REMOTE DEVICE CHECK
Battery Voltage: 65
Date Time Interrogation Session: 20240529104018
Implantable Lead Connection Status: 753985
Implantable Lead Connection Status: 753985
Implantable Lead Implant Date: 20191129
Implantable Lead Implant Date: 20191129
Implantable Lead Location: 753859
Implantable Lead Location: 753860
Implantable Lead Model: 5076
Implantable Lead Model: 5076
Implantable Pulse Generator Implant Date: 20191129
Pulse Gen Model: 407145
Pulse Gen Serial Number: 69449339

## 2022-12-11 NOTE — Progress Notes (Signed)
Remote pacemaker transmission.   

## 2022-12-29 ENCOUNTER — Telehealth: Payer: Self-pay | Admitting: Internal Medicine

## 2022-12-29 NOTE — Telephone Encounter (Signed)
Patient is wishing to transfer her care specifically over to do Dr. Marina Goodell. Has previously history with [facility]. Would like to transfer her care due to wanting her providers all within the [facility] group. Also would like to stick with what her primary care provider recommends. Patient will have previous records sent.

## 2022-12-30 ENCOUNTER — Other Ambulatory Visit: Payer: Self-pay | Admitting: Internal Medicine

## 2022-12-30 DIAGNOSIS — Z1231 Encounter for screening mammogram for malignant neoplasm of breast: Secondary | ICD-10-CM

## 2023-01-02 ENCOUNTER — Ambulatory Visit
Admission: RE | Admit: 2023-01-02 | Discharge: 2023-01-02 | Disposition: A | Discharge status: Home / Self Care | Payer: BLUE CROSS/BLUE SHIELD | Source: Ambulatory Visit

## 2023-01-02 DIAGNOSIS — D2372 Other benign neoplasm of skin of left lower limb, including hip: Secondary | ICD-10-CM | POA: Diagnosis not present

## 2023-01-02 DIAGNOSIS — D225 Melanocytic nevi of trunk: Secondary | ICD-10-CM | POA: Diagnosis not present

## 2023-01-02 DIAGNOSIS — L821 Other seborrheic keratosis: Secondary | ICD-10-CM | POA: Diagnosis not present

## 2023-01-02 DIAGNOSIS — Z1231 Encounter for screening mammogram for malignant neoplasm of breast: Secondary | ICD-10-CM

## 2023-01-02 DIAGNOSIS — D235 Other benign neoplasm of skin of trunk: Secondary | ICD-10-CM | POA: Diagnosis not present

## 2023-01-05 ENCOUNTER — Telehealth: Payer: Self-pay | Admitting: Internal Medicine

## 2023-01-05 NOTE — Telephone Encounter (Signed)
Hi Dr. Marina Goodell,   Patient called requesting a transfer of care specifically over to you if possible. She stated she would like to have all her providers within the [facility] and also would like to stick with what her PCP recommended for her. She also has GI history and her records were obtained and scanned into [employer] for you to review.    Thank you

## 2023-01-05 NOTE — Telephone Encounter (Signed)
Okay to transfer GI care to me

## 2023-01-06 ENCOUNTER — Encounter: Payer: Self-pay | Admitting: Internal Medicine

## 2023-01-06 ENCOUNTER — Other Ambulatory Visit: Payer: Self-pay | Admitting: Internal Medicine

## 2023-01-06 DIAGNOSIS — R928 Other abnormal and inconclusive findings on diagnostic imaging of breast: Secondary | ICD-10-CM

## 2023-01-06 NOTE — Telephone Encounter (Signed)
 Called patient to advise and possibly schedule left voicemail.

## 2023-01-21 ENCOUNTER — Ambulatory Visit
Admission: RE | Admit: 2023-01-21 | Discharge: 2023-01-21 | Disposition: A | Discharge status: Home / Self Care | Payer: BLUE CROSS/BLUE SHIELD | Source: Ambulatory Visit | Attending: Internal Medicine | Admitting: Internal Medicine

## 2023-01-21 DIAGNOSIS — R921 Mammographic calcification found on diagnostic imaging of breast: Secondary | ICD-10-CM | POA: Diagnosis not present

## 2023-01-21 DIAGNOSIS — R928 Other abnormal and inconclusive findings on diagnostic imaging of breast: Secondary | ICD-10-CM

## 2023-01-21 DIAGNOSIS — Z9882 Breast implant status: Secondary | ICD-10-CM | POA: Diagnosis not present

## 2023-02-18 ENCOUNTER — Ambulatory Visit (INDEPENDENT_AMBULATORY_CARE_PROVIDER_SITE_OTHER): Payer: BLUE CROSS/BLUE SHIELD

## 2023-02-18 ENCOUNTER — Telehealth: Payer: Self-pay

## 2023-02-18 DIAGNOSIS — I495 Sick sinus syndrome: Secondary | ICD-10-CM

## 2023-02-19 LAB — CUP PACEART REMOTE DEVICE CHECK
Battery Voltage: 60
Date Time Interrogation Session: 20240828093105
Implantable Lead Connection Status: 753985
Implantable Lead Connection Status: 753985
Implantable Lead Implant Date: 20191129
Implantable Lead Implant Date: 20191129
Implantable Lead Location: 753859
Implantable Lead Location: 753860
Implantable Lead Model: 5076
Implantable Lead Model: 5076
Implantable Pulse Generator Implant Date: 20191129
Pulse Gen Model: 407145
Pulse Gen Serial Number: 69449339

## 2023-02-27 NOTE — Progress Notes (Signed)
Remote pacemaker transmission.   

## 2023-04-08 DIAGNOSIS — E781 Pure hyperglyceridemia: Secondary | ICD-10-CM | POA: Diagnosis not present

## 2023-04-08 DIAGNOSIS — R7301 Impaired fasting glucose: Secondary | ICD-10-CM | POA: Diagnosis not present

## 2023-04-08 DIAGNOSIS — Z1389 Encounter for screening for other disorder: Secondary | ICD-10-CM | POA: Diagnosis not present

## 2023-04-15 DIAGNOSIS — Z0000 Encounter for general adult medical examination without abnormal findings: Secondary | ICD-10-CM | POA: Diagnosis not present

## 2023-04-15 DIAGNOSIS — Z1331 Encounter for screening for depression: Secondary | ICD-10-CM | POA: Diagnosis not present

## 2023-04-15 DIAGNOSIS — R82998 Other abnormal findings in urine: Secondary | ICD-10-CM | POA: Diagnosis not present

## 2023-04-15 DIAGNOSIS — E781 Pure hyperglyceridemia: Secondary | ICD-10-CM | POA: Diagnosis not present

## 2023-04-15 DIAGNOSIS — Z1339 Encounter for screening examination for other mental health and behavioral disorders: Secondary | ICD-10-CM | POA: Diagnosis not present

## 2023-04-15 DIAGNOSIS — Z23 Encounter for immunization: Secondary | ICD-10-CM | POA: Diagnosis not present

## 2023-04-30 ENCOUNTER — Ambulatory Visit: Payer: BLUE CROSS/BLUE SHIELD | Admitting: Internal Medicine

## 2023-04-30 ENCOUNTER — Encounter: Payer: Self-pay | Admitting: Internal Medicine

## 2023-04-30 ENCOUNTER — Telehealth: Payer: Self-pay | Admitting: Internal Medicine

## 2023-04-30 VITALS — BP 122/70 | HR 77 | Ht 66.0 in | Wt 187.0 lb

## 2023-04-30 DIAGNOSIS — R109 Unspecified abdominal pain: Secondary | ICD-10-CM

## 2023-04-30 DIAGNOSIS — Z800 Family history of malignant neoplasm of digestive organs: Secondary | ICD-10-CM | POA: Diagnosis not present

## 2023-04-30 DIAGNOSIS — R194 Change in bowel habit: Secondary | ICD-10-CM | POA: Diagnosis not present

## 2023-04-30 MED ORDER — NA SULFATE-K SULFATE-MG SULF 17.5-3.13-1.6 GM/177ML PO SOLN: 1.0000 | Freq: Once | ORAL | 0 refills | Status: AC

## 2023-04-30 NOTE — Patient Instructions (Signed)
You have been scheduled for a colonoscopy. Please follow written instructions given to you at your visit today.   Please pick up your prep supplies at the pharmacy within the next 1-3 days.  If you use inhalers (even only as needed), please bring them with you on the day of your procedure.  DO NOT TAKE 7 DAYS PRIOR TO TEST- Trulicity (dulaglutide) Ozempic, Wegovy (semaglutide) Mounjaro (tirzepatide) Bydureon Bcise (exanatide extended release)  DO NOT TAKE 1 DAY PRIOR TO YOUR TEST Rybelsus (semaglutide) Adlyxin (lixisenatide) Victoza (liraglutide) Byetta (exanatide) ___________________________________________________________________________  _______________________________________________________  If your blood pressure at your visit was 140/90 or greater, please contact your primary care physician to follow up on this.  _______________________________________________________  If you are age 55 or older, your body mass index should be between 23-30. Your Body mass index is 30.18 kg/m. If this is out of the aforementioned range listed, please consider follow up with your Primary Care Provider.  If you are age 43 or younger, your body mass index should be between 19-25. Your Body mass index is 30.18 kg/m. If this is out of the aformentioned range listed, please consider follow up with your Primary Care Provider.   ________________________________________________________  The [place] [facility] providers would like to encourage you to use [facility] to communicate with providers for non-urgent requests or questions.  Due to long hold times on the telephone, sending your provider a message by [facility] may be a faster and more efficient way to get a response.  Please allow 48 business hours for a response.  Please remember that this is for non-urgent requests.  _______________________________________________________

## 2023-04-30 NOTE — Telephone Encounter (Signed)
Pt seen today

## 2023-04-30 NOTE — Telephone Encounter (Signed)
Spoke with patient and told her she was fine to have colon upstairs with a pacemaker.  Patient agreed.

## 2023-04-30 NOTE — Telephone Encounter (Signed)
Inbound call from patient stating she forgot to mention that she currently has a pacemaker. Patient requesting a call to make sure it is okay to proceed with 12/30 colonoscopy. Please advise, thank you.

## 2023-04-30 NOTE — Progress Notes (Signed)
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 Clelia Croft, regarding change in bowel habits with abdominal cramping and the need for colonoscopy.  Also, family history of colon cancer.  Patient has a family history of colon cancer in her father.  She tells me that she has undergone colonoscopy in [facility] November 2018.  No polyps.  However, due to family history, told to come back in 5 years.  She is due for follow-up at this time.  She tells me that she was in her usual state of health until February 2023 when she developed acute illness which comprised of nausea, vomiting, abdominal cramping and diarrhea.  This dissipated.  However, thereafter she was left with episodic bouts of abdominal cramping followed by diarrhea.  She will typically have 2 or 3 bowel movements that are loose following abdominal cramping.  This can occur anytime of the day.  Often after eating.  She cannot identify any types of foods that are problematic, though she has initiated which she calls "a bland diet".  She was tested for alpha gal which was negative.  No bleeding.  No weight loss.  Last episode of cramping loose stools occurred about 2 weeks ago.  Shortly after completing defecation, she feels better.  No other GI complaints.  Outside records from [facility] medical reviewed.  Abdominal ultrasound: December 31, 2021 hepatic steatosis.  Otherwise unremarkable Laboratories: Celiac testing July 2023 was negative.  CBC unremarkable with hemoglobin 13.7.  Comprehensive metabolic panel normal including liver tests.  REVIEW OF SYSTEMS:  All non-GI ROS negative unless otherwise stated in the HPI.  Past Medical History:  Diagnosis Date   History of prediabetes 06/20/2017   IFG (impaired fasting glucose)    PCOS (polycystic ovarian  syndrome)    Presence of permanent cardiac pacemaker 05/21/2018   Syncope and collapse 05/2017; 07/2017; 04/2018    Past Surgical History:  Procedure Laterality Date   AUGMENTATION MAMMAPLASTY Bilateral 1995   INSERT / REPLACE / REMOVE PACEMAKER  05/21/2018   LACERATION REPAIR Left    eyebrow   LOOP RECORDER INSERTION N/A 08/21/2017   Procedure: LOOP RECORDER INSERTION;  Surgeon: Duke Salvia, MD;  Location: [facility] INVASIVE CV LAB;  Service: Cardiovascular;  Laterality: N/A;   LOOP RECORDER REMOVAL  05/21/2018   LOOP RECORDER REMOVAL N/A 05/21/2018   Procedure: LOOP RECORDER REMOVAL;  Surgeon: Duke Salvia, MD;  Location: [facility] INVASIVE CV LAB;  Service: Cardiovascular;  Laterality: N/A;   PACEMAKER IMPLANT N/A 05/21/2018   Procedure: PACEMAKER IMPLANT - Dual Chamber;  Surgeon: Duke Salvia, MD;  Location: [facility] INVASIVE CV LAB;  Service: Cardiovascular;  Laterality: N/A;   TONSILLECTOMY AND ADENOIDECTOMY      Social History Emily Cook  reports that she has never smoked. She has never used smokeless tobacco. She reports that she does not drink alcohol and does not use drugs.  family history includes Breast cancer in her mother; Colon polyps in her father; Diabetes in her paternal grandfather; Hypertension in her mother.  No Known Allergies     PHYSICAL EXAMINATION: Vital signs: BP 122/70   Pulse 77   Ht 5\' 6"  (1.676 m)   Wt 187 lb (84.8 kg)   BMI 30.18 kg/m   Constitutional: generally well-appearing, no acute distress Psychiatric: alert and oriented x3, cooperative Eyes: extraocular  movements intact, anicteric, conjunctiva pink Mouth: oral pharynx moist, no lesions Neck: supple no lymphadenopathy Cardiovascular: heart regular rate and rhythm, no murmur Lungs: clear to auscultation bilaterally Abdomen: soft, nontender, nondistended, no obvious ascites, no peritoneal signs, normal bowel sounds, no organomegaly Rectal: Deferred to colonoscopy Extremities: no clubbing,  cyanosis, or lower extremity edema bilaterally Skin: no lesions on visible extremities Neuro: No focal deficits.  Cranial nerves intact  ASSESSMENT:  1.  Family history of colon cancer.  Last colonoscopy November 2018.  Due for follow-up. 2.  Change in bowel habits that began with acute illness February 2023.  Now left with residual balance as described.  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 3.  Status post pacemaker placement   PLAN:  1.  Schedule colonoscopy for follow-up screening as well as to evaluate change in bowel habits.  Would obtain biopsies to rule out microscopic colitis. 2.  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  Given the infrequent and short-lived nature of her problem when it occurs, would recommend fiber supplementation (as opposed to regular medical therapy. 3.  Ongoing general medical care with Dr. Clelia Croft Total time 60 minutes of spent preparing to see the patient, reviewing outside records, obtaining comprehensive history, performing medically appropriate physical examination, counseling and educating the patient regarding above listed issues, ordering colonoscopy, and documenting clinical information in the health record

## 2023-05-20 ENCOUNTER — Ambulatory Visit: Payer: BLUE CROSS/BLUE SHIELD

## 2023-05-20 DIAGNOSIS — I495 Sick sinus syndrome: Secondary | ICD-10-CM | POA: Diagnosis not present

## 2023-05-20 LAB — CUP PACEART REMOTE DEVICE CHECK
Battery Voltage: 60
Date Time Interrogation Session: 20241127081935
Implantable Lead Connection Status: 753985
Implantable Lead Connection Status: 753985
Implantable Lead Implant Date: 20191129
Implantable Lead Implant Date: 20191129
Implantable Lead Location: 753859
Implantable Lead Location: 753860
Implantable Lead Model: 5076
Implantable Lead Model: 5076
Implantable Pulse Generator Implant Date: 20191129
Pulse Gen Model: 407145
Pulse Gen Serial Number: 69449339

## 2023-06-22 ENCOUNTER — Encounter: Payer: Self-pay | Admitting: Internal Medicine

## 2023-06-22 ENCOUNTER — Ambulatory Visit (AMBULATORY_SURGERY_CENTER): Payer: BLUE CROSS/BLUE SHIELD | Admitting: Internal Medicine

## 2023-06-22 VITALS — BP 121/55 | HR 65 | Temp 97.7°F | Resp 14 | Ht 66.0 in | Wt 187.0 lb

## 2023-06-22 DIAGNOSIS — K5730 Diverticulosis of large intestine without perforation or abscess without bleeding: Secondary | ICD-10-CM | POA: Diagnosis not present

## 2023-06-22 DIAGNOSIS — Z83719 Family history of colon polyps, unspecified: Secondary | ICD-10-CM

## 2023-06-22 DIAGNOSIS — Z1211 Encounter for screening for malignant neoplasm of colon: Secondary | ICD-10-CM | POA: Diagnosis not present

## 2023-06-22 DIAGNOSIS — Z800 Family history of malignant neoplasm of digestive organs: Secondary | ICD-10-CM

## 2023-06-22 MED ORDER — SODIUM CHLORIDE 0.9 % IV SOLN: 500.0000 mL | Freq: Once | INTRAVENOUS | Status: DC

## 2023-06-22 NOTE — Progress Notes (Signed)
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  Also, family history of colon cancer.   Patient has a family history of colon cancer in her father.  She tells me that she has undergone colonoscopy in [facility] November 2018.  No polyps.  However, due to family history, told to come back in 5 years.  She is due for follow-up at this time.   She tells me that she was in her usual state of health until February 2023 when she developed acute illness which comprised of nausea, vomiting, abdominal cramping and diarrhea.  This dissipated.  However, thereafter she was left with episodic bouts of abdominal cramping followed by diarrhea.  She will typically have 2 or 3 bowel movements that are loose following abdominal cramping.  This can occur anytime of the day.  Often after eating.  She cannot identify any types of foods that are problematic, though she has initiated which she calls "a bland diet".  She was tested for alpha gal which was negative.  No bleeding.  No weight loss.  Last episode of cramping loose stools occurred about 2 weeks ago.  Shortly after completing defecation, she feels better.   No other GI complaints.  Outside records from [facility] medical reviewed.   Abdominal ultrasound: December 31, 2021 hepatic steatosis.  Otherwise unremarkable Laboratories: Celiac testing July 2023 was negative.  CBC unremarkable with hemoglobin 13.7.  Comprehensive metabolic panel normal including liver tests.   REVIEW OF SYSTEMS:   All non-GI ROS negative unless otherwise stated in the HPI.       Past Medical History:  Diagnosis Date   History of prediabetes 06/20/2017   IFG (impaired fasting  glucose)     PCOS (polycystic ovarian syndrome)     Presence of permanent cardiac pacemaker 05/21/2018   Syncope and collapse 05/2017; 07/2017; 04/2018               Past Surgical History:  Procedure Laterality Date   AUGMENTATION MAMMAPLASTY Bilateral 1995   INSERT / REPLACE / REMOVE PACEMAKER   05/21/2018   LACERATION REPAIR Left      eyebrow   LOOP RECORDER INSERTION N/A 08/21/2017    Procedure: LOOP RECORDER INSERTION;  Surgeon: Duke Salvia, MD;  Location: [facility] INVASIVE CV LAB;  Service: Cardiovascular;  Laterality: N/A;   LOOP RECORDER REMOVAL   05/21/2018   LOOP RECORDER REMOVAL N/A 05/21/2018    Procedure: LOOP RECORDER REMOVAL;  Surgeon: Duke Salvia, MD;  Location: [facility] INVASIVE CV LAB;  Service: Cardiovascular;  Laterality: N/A;   PACEMAKER IMPLANT N/A 05/21/2018    Procedure: PACEMAKER IMPLANT - Dual Chamber;  Surgeon: Duke Salvia, MD;  Location: [facility] INVASIVE CV LAB;  Service: Cardiovascular;  Laterality: N/A;   TONSILLECTOMY AND ADENOIDECTOMY              Social History Emily Cook  reports that she has never smoked. She has never used smokeless tobacco. She reports that she does not drink alcohol and does not use drugs.   family history includes Breast cancer in her mother; Colon polyps in her father; Diabetes in her paternal grandfather; Hypertension in her mother.   Allergies  No Known Allergies  PHYSICAL EXAMINATION: Vital signs: BP 122/70   Pulse 77   Ht 5\' 6"  (1.676 m)   Wt 187 lb (84.8 kg)   BMI 30.18 kg/m   Constitutional: generally well-appearing, no acute distress Psychiatric: alert and oriented x3, cooperative Eyes: extraocular movements intact, anicteric, conjunctiva pink Mouth: oral pharynx moist, no lesions Neck: supple no lymphadenopathy Cardiovascular: heart regular rate and rhythm, no murmur Lungs: clear to auscultation bilaterally Abdomen: soft, nontender, nondistended, no obvious ascites, no peritoneal signs, normal  bowel sounds, no organomegaly Rectal: Deferred to colonoscopy Extremities: no clubbing, cyanosis, or lower extremity edema bilaterally Skin: no lesions on visible extremities Neuro: No focal deficits.  Cranial nerves intact   ASSESSMENT:   1.  Family history of colon cancer.  Last colonoscopy November 2018.  Due for follow-up. 2.  Change in bowel habits that began with acute illness February 2023.  Now left with residual balance as described.  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 3.  Status post pacemaker placement     PLAN:   1.  Schedule colonoscopy for follow-up screening as well as to evaluate change in bowel habits.  Would obtain biopsies to rule out microscopic colitis. 2.  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  Given the infrequent and short-lived nature of her problem when it occurs, would recommend fiber supplementation (as opposed to regular medical therapy. 3.  Ongoing general medical care with Emily Cook  No interval change.  Now for colonoscopy

## 2023-06-22 NOTE — Patient Instructions (Addendum)
Patient has a contact number available for                            emergencies. The signs and symptoms of potential                            delayed complications were discussed with the                            patient. Return to normal activities tomorrow.                            Written discharge instructions were provided to the                            patient.                           - Resume previous diet.                           - Continue present medications.  Handout on diverticulosis given.     YOU HAD AN ENDOSCOPIC PROCEDURE TODAY AT THE [facility]:   Refer to the procedure report that was given to you for any specific questions about what was found during the examination.  If the procedure report does not answer your questions, please call your gastroenterologist to clarify.  If you requested that your care partner not be given the details of your procedure findings, then the procedure report has been included in a sealed envelope for you to review at your convenience later.  YOU SHOULD EXPECT: Some feelings of bloating in the abdomen. Passage of more gas than usual.  Walking can help get rid of the air that was put into your GI tract during the procedure and reduce the bloating. If you had a lower endoscopy (such as a colonoscopy or flexible sigmoidoscopy) you may notice spotting of blood in your stool or on the toilet paper. If you underwent a bowel prep for your procedure, you may not have a normal bowel movement for a few days.  Please Note:  You might notice some irritation and congestion in your nose or some drainage.  This is from the oxygen used during your procedure.  There is no need for concern and it should clear up in a day or so.  SYMPTOMS TO REPORT IMMEDIATELY:  Following lower endoscopy (colonoscopy or flexible sigmoidoscopy):  Excessive amounts of blood in the stool  Significant tenderness or worsening of abdominal pains  Swelling of  the abdomen that is new, acute  Fever of 100F or higher   For urgent or emergent issues, a gastroenterologist can be reached at any hour by calling [phone]. Do not use MyChart messaging for urgent concerns.    DIET:  We do recommend a small meal at first, but then you may proceed to your regular diet.  Drink plenty of fluids but you should avoid alcoholic beverages for 24 hours.  ACTIVITY:  You should plan to take it easy for the rest of today and you should NOT DRIVE or use heavy machinery until tomorrow (because of the sedation medicines used during the test).  FOLLOW UP: Our staff will call the number listed on your records the next business day following your procedure.  We will call around 7:15- 8:00 am to check on you and address any questions or concerns that you may have regarding the information given to you following your procedure. If we do not reach you, we will leave a message.     If any biopsies were taken you will be contacted by phone or by letter within the next 1-3 weeks.  Please call us at [phone] if you have not heard about the biopsies in 3 weeks.    SIGNATURES/CONFIDENTIALITY: You and/or your care partner have signed paperwork which will be entered into your electronic medical record.  These signatures attest to the fact that that the information above on your After Visit Summary has been reviewed and is understood.  Full responsibility of the confidentiality of this discharge information lies with you and/or your care-partner.

## 2023-06-22 NOTE — Progress Notes (Signed)
Report to PACU, RN, vss, BBS= Clear.  

## 2023-06-22 NOTE — Op Note (Signed)
[facility] Patient Name: Emily Cook Procedure Date: 06/22/2023 8:13 AM MRN: [id number] Endoscopist: Wilhemina Bonito. Marina Goodell , MD, [id number] Age: 52 Referring MD:  Date of Birth: 11-05-1970 Gender: Female Account #: [id number] Procedure:                Colonoscopy Indications:              Colon cancer screening in patient at increased                            risk: Colorectal cancer in father (father with                            advanced neoplasia requiring colonic resection).                            Prior colonoscopy in [place] 2018 was negative                            for neoplasia Medicines:                Monitored Anesthesia Care Procedure:                Pre-Anesthesia Assessment:                           - Prior to the procedure, a History and Physical                            was performed, and patient medications and                            allergies were reviewed. The patient's tolerance of                            previous anesthesia was also reviewed. The risks                            and benefits of the procedure and the sedation                            options and risks were discussed with the patient.                            All questions were answered, and informed consent                            was obtained. Prior Anticoagulants: The patient has                            taken no anticoagulant or antiplatelet agents. ASA                            Grade Assessment: II - A patient with mild systemic  disease. After reviewing the risks and benefits,                            the patient was deemed in satisfactory condition to                            undergo the procedure.                           After obtaining informed consent, the colonoscope                            was passed under direct vision. Throughout the                            procedure, the patient's blood pressure, pulse, and                             oxygen saturations were monitored continuously. The                            CF [id number] #[id number] was introduced through the anus                            and advanced to the the cecum, identified by                            appendiceal orifice and ileocecal valve. The                            ileocecal valve, appendiceal orifice, and rectum                            were photographed. The quality of the bowel                            preparation was excellent. The colonoscopy was                            performed without difficulty. The patient tolerated                            the procedure well. The bowel preparation used was                            SUPREP via split dose instruction. Scope In: 8:31:28 AM Scope Out: 8:44:55 AM Scope Withdrawal Time: 0 hours 10 minutes 57 seconds  Total Procedure Duration: 0 hours 13 minutes 27 seconds  Findings:                 A few small-mouthed diverticula were found in the                            sigmoid colon.  The exam was otherwise without abnormality on                            direct and retroflexion views. Complications:            No immediate complications. Estimated blood loss:                            None. Estimated Blood Loss:     Estimated blood loss: none. Impression:               - Diverticulosis in the sigmoid colon.                           - The examination was otherwise normal on direct                            and retroflexion views.                           - No specimens collected. Recommendation:           - Repeat colonoscopy in 5 years for screening                            purposes (family history).                           - Patient has a contact number available for                            emergencies. The signs and symptoms of potential                            delayed complications were discussed with the                             patient. Return to normal activities tomorrow.                            Written discharge instructions were provided to the                            patient.                           - Resume previous diet.                           - Continue present medications. Wilhemina Bonito. Marina Goodell, MD 06/22/2023 8:52:46 AM This report has been signed electronically.

## 2023-06-23 ENCOUNTER — Telehealth: Payer: Self-pay | Admitting: *Deleted

## 2023-06-23 NOTE — Telephone Encounter (Signed)
  Follow up Call-     06/22/2023    7:18 AM  Call back number  Post procedure Call Back phone  # [phone]  Permission to leave phone message Yes     Patient questions:  Do you have a fever, pain , or abdominal swelling? No. Pain Score  0 *  Have you tolerated food without any problems? Yes.    Have you been able to return to your normal activities? Yes.    Do you have any questions about your discharge instructions: Diet   No. Medications  No. Follow up visit  No.  Do you have questions or concerns about your Care? No.  Actions: * If pain score is 4 or above: No action needed, pain <4.

## 2023-08-19 ENCOUNTER — Ambulatory Visit (INDEPENDENT_AMBULATORY_CARE_PROVIDER_SITE_OTHER): Payer: BLUE CROSS/BLUE SHIELD

## 2023-08-19 DIAGNOSIS — I495 Sick sinus syndrome: Secondary | ICD-10-CM

## 2023-08-20 LAB — CUP PACEART REMOTE DEVICE CHECK
Date Time Interrogation Session: 20250226091559
Implantable Lead Connection Status: 753985
Implantable Lead Connection Status: 753985
Implantable Lead Implant Date: 20191129
Implantable Lead Implant Date: 20191129
Implantable Lead Location: 753859
Implantable Lead Location: 753860
Implantable Lead Model: 5076
Implantable Lead Model: 5076
Implantable Pulse Generator Implant Date: 20191129
Pulse Gen Model: 407145
Pulse Gen Serial Number: 69449339

## 2023-08-26 DIAGNOSIS — J069 Acute upper respiratory infection, unspecified: Secondary | ICD-10-CM | POA: Diagnosis not present

## 2023-09-08 ENCOUNTER — Encounter: Payer: Self-pay | Admitting: Internal Medicine

## 2023-09-23 NOTE — Addendum Note (Signed)
 Addended by: Elease Etienne A on: 09/23/2023 09:46 AM   Modules accepted: Orders

## 2023-11-26 ENCOUNTER — Ambulatory Visit: Attending: Cardiology | Admitting: Cardiology

## 2023-11-26 ENCOUNTER — Encounter: Payer: Self-pay | Admitting: Cardiology

## 2023-11-26 VITALS — BP 144/78 | HR 75 | Ht 66.0 in

## 2023-11-26 DIAGNOSIS — R55 Syncope and collapse: Secondary | ICD-10-CM

## 2023-11-26 DIAGNOSIS — Z950 Presence of cardiac pacemaker: Secondary | ICD-10-CM

## 2023-11-26 DIAGNOSIS — I495 Sick sinus syndrome: Secondary | ICD-10-CM | POA: Diagnosis not present

## 2023-11-26 NOTE — Patient Instructions (Signed)

## 2023-11-26 NOTE — Progress Notes (Signed)
  Electrophysiology Office Note:   Date:  11/26/2023  ID:  Emily Cook, DOB 02-Sep-1970, MRN [id number]  Primary Cardiologist: Richardo Chandler, MD Electrophysiologist: Ardeen Kohler, MD      History of Present Illness:   Emily Cook is a 53 y.o. female with h/o syncope with asystole s/p pacemaker who is being seen today to re-establish care for her pacemaker.   Discussed the use of AI scribe software for clinical note transcription with the patient, who gave verbal consent to proceed.  History of Present Illness She is here for a check of her pacemaker. She feels well and has not experienced any limitations in her activities due to her heart rate. Post-implantation recovery was uneventful, and there are no issues with the device site.She maintains an active lifestyle, playing tennis daily or every other day, with matches three to four times a week at various locations including [address], [place], [place], and [place]. She is part of a tennis community and regularly participates in matches and practices.She is concerned about her weight, which has been increasing despite her high level of physical activity. She inquired about the safety of taking weight loss medications such as Wegovy or Mounjaro with her pacemaker. She has no new or acute complaints.   Review of systems complete and found to be negative unless listed in HPI.   EP Information / Studies Reviewed:    EKG is ordered today. Personal review as below.  EKG Interpretation Date/Time:  Thursday November 26 2023 12:11:43 EDT Ventricular Rate:  75 PR Interval:  164 QRS Duration:  86 QT Interval:  378 QTC Calculation: 422 R Axis:   70  Text Interpretation: Atrial-paced rhythm When compared with ECG of 22-May-2018 05:02, No significant change was found Confirmed by Ardeen Kohler [phone]) on 11/26/2023 10:32:28 PM  Physical Exam:   VS:  BP (!) 144/78   Pulse 75   Ht 5\' 6"  (1.676 m)   SpO2 98%   BMI 30.18 kg/m    Wt  Readings from Last 3 Encounters:  06/22/23 187 lb (84.8 kg)  04/30/23 187 lb (84.8 kg)  05/26/19 196 lb 12.8 oz (89.3 kg)     GEN: Well nourished, well developed in no acute distress NECK: No JVD CARDIAC: Normal rate, regular rhythm.  Well-healed left chest pacemaker pocket. RESPIRATORY:  Clear to auscultation without rales, wheezing or rhonchi  ABDOMEN: Soft, non-distended EXTREMITIES:  No edema; No deformity   ASSESSMENT AND PLAN:    Syncope: No recent episodes. Sinus node dysfunction: Atrial paced 45%. S/p dual chamber Biotronik pacemaker - In-clinic device interrogation was performed today.  Appropriate device function and stable lead parameters.  Presenting rhythm is a paced V sensed.  Estimated battery life 5.5 years.  No programming changes were made. - Continue remote monitoring.  Follow up with Dr. Daneil Dunker in 12 months  Signed, Ardeen Kohler, MD

## 2024-01-06 DIAGNOSIS — L905 Scar conditions and fibrosis of skin: Secondary | ICD-10-CM | POA: Diagnosis not present

## 2024-01-06 DIAGNOSIS — D235 Other benign neoplasm of skin of trunk: Secondary | ICD-10-CM | POA: Diagnosis not present

## 2024-01-06 DIAGNOSIS — D2262 Melanocytic nevi of left upper limb, including shoulder: Secondary | ICD-10-CM | POA: Diagnosis not present

## 2024-01-06 DIAGNOSIS — D2261 Melanocytic nevi of right upper limb, including shoulder: Secondary | ICD-10-CM | POA: Diagnosis not present

## 2024-02-17 ENCOUNTER — Ambulatory Visit (INDEPENDENT_AMBULATORY_CARE_PROVIDER_SITE_OTHER): Payer: BLUE CROSS/BLUE SHIELD

## 2024-02-17 DIAGNOSIS — I495 Sick sinus syndrome: Secondary | ICD-10-CM

## 2024-02-18 LAB — CUP PACEART REMOTE DEVICE CHECK
Date Time Interrogation Session: 20250827105523
Implantable Lead Connection Status: 753985
Implantable Lead Connection Status: 753985
Implantable Lead Implant Date: 20191129
Implantable Lead Implant Date: 20191129
Implantable Lead Location: 753859
Implantable Lead Location: 753860
Implantable Lead Model: 5076
Implantable Lead Model: 5076
Implantable Pulse Generator Implant Date: 20191129
Pulse Gen Model: 407145
Pulse Gen Serial Number: 69449339

## 2024-02-21 ENCOUNTER — Ambulatory Visit: Payer: Self-pay | Admitting: Cardiology

## 2024-03-07 NOTE — Progress Notes (Signed)
 Remote PPM Transmission

## 2024-05-09 ENCOUNTER — Other Ambulatory Visit: Payer: Self-pay | Admitting: Internal Medicine

## 2024-05-09 DIAGNOSIS — Z1231 Encounter for screening mammogram for malignant neoplasm of breast: Secondary | ICD-10-CM

## 2024-05-10 ENCOUNTER — Ambulatory Visit
Admission: RE | Admit: 2024-05-10 | Discharge: 2024-05-10 | Disposition: A | Source: Ambulatory Visit | Attending: Internal Medicine | Admitting: Internal Medicine

## 2024-05-10 DIAGNOSIS — Z1231 Encounter for screening mammogram for malignant neoplasm of breast: Secondary | ICD-10-CM

## 2024-05-10 DIAGNOSIS — E282 Polycystic ovarian syndrome: Secondary | ICD-10-CM | POA: Diagnosis not present

## 2024-05-13 ENCOUNTER — Other Ambulatory Visit: Payer: Self-pay | Admitting: Internal Medicine

## 2024-05-13 DIAGNOSIS — R928 Other abnormal and inconclusive findings on diagnostic imaging of breast: Secondary | ICD-10-CM

## 2024-05-17 ENCOUNTER — Ambulatory Visit
Admission: RE | Admit: 2024-05-17 | Discharge: 2024-05-17 | Disposition: A | Discharge status: Home / Self Care | Source: Ambulatory Visit | Attending: Internal Medicine | Admitting: Internal Medicine

## 2024-05-17 ENCOUNTER — Ambulatory Visit

## 2024-05-17 DIAGNOSIS — Z1331 Encounter for screening for depression: Secondary | ICD-10-CM | POA: Diagnosis not present

## 2024-05-17 DIAGNOSIS — R928 Other abnormal and inconclusive findings on diagnostic imaging of breast: Secondary | ICD-10-CM | POA: Diagnosis not present

## 2024-05-17 DIAGNOSIS — Z0000 Encounter for general adult medical examination without abnormal findings: Secondary | ICD-10-CM | POA: Diagnosis not present

## 2024-05-17 DIAGNOSIS — R82998 Other abnormal findings in urine: Secondary | ICD-10-CM | POA: Diagnosis not present

## 2024-05-17 DIAGNOSIS — K580 Irritable bowel syndrome with diarrhea: Secondary | ICD-10-CM | POA: Diagnosis not present

## 2024-05-17 DIAGNOSIS — R7301 Impaired fasting glucose: Secondary | ICD-10-CM | POA: Diagnosis not present

## 2024-05-17 DIAGNOSIS — Z1339 Encounter for screening examination for other mental health and behavioral disorders: Secondary | ICD-10-CM | POA: Diagnosis not present

## 2024-05-18 ENCOUNTER — Ambulatory Visit: Payer: BLUE CROSS/BLUE SHIELD

## 2024-05-18 DIAGNOSIS — I495 Sick sinus syndrome: Secondary | ICD-10-CM | POA: Diagnosis not present

## 2024-05-26 LAB — CUP PACEART REMOTE DEVICE CHECK
Battery Voltage: 50
Date Time Interrogation Session: 20251201091925
Implantable Lead Connection Status: 753985
Implantable Lead Connection Status: 753985
Implantable Lead Implant Date: 20191129
Implantable Lead Implant Date: 20191129
Implantable Lead Location: 753859
Implantable Lead Location: 753860
Implantable Lead Model: 5076
Implantable Lead Model: 5076
Implantable Pulse Generator Implant Date: 20191129
Pulse Gen Model: 407145
Pulse Gen Serial Number: 69449339

## 2024-05-26 NOTE — Progress Notes (Signed)
 Remote PPM Transmission

## 2024-05-30 ENCOUNTER — Other Ambulatory Visit

## 2024-05-30 ENCOUNTER — Encounter

## 2024-06-10 ENCOUNTER — Ambulatory Visit: Payer: Self-pay | Admitting: Cardiology
